# Patient Record
Sex: Female | Born: 1935
Health system: Southern US, Community
[De-identification: ages and names within clinical notes are randomized; demographics above are authoritative.]

## PROBLEM LIST (undated history)

## (undated) DIAGNOSIS — F419 Anxiety disorder, unspecified: Secondary | ICD-10-CM

## (undated) DIAGNOSIS — G4730 Sleep apnea, unspecified: Secondary | ICD-10-CM

## (undated) DIAGNOSIS — K219 Gastro-esophageal reflux disease without esophagitis: Secondary | ICD-10-CM

## (undated) DIAGNOSIS — J189 Pneumonia, unspecified organism: Secondary | ICD-10-CM

## (undated) DIAGNOSIS — M1990 Unspecified osteoarthritis, unspecified site: Secondary | ICD-10-CM

## (undated) DIAGNOSIS — E7800 Pure hypercholesterolemia, unspecified: Secondary | ICD-10-CM

## (undated) DIAGNOSIS — I10 Essential (primary) hypertension: Secondary | ICD-10-CM

## (undated) DIAGNOSIS — K649 Unspecified hemorrhoids: Secondary | ICD-10-CM

## (undated) DIAGNOSIS — E039 Hypothyroidism, unspecified: Secondary | ICD-10-CM

## (undated) DIAGNOSIS — D126 Benign neoplasm of colon, unspecified: Secondary | ICD-10-CM

## (undated) DIAGNOSIS — I499 Cardiac arrhythmia, unspecified: Secondary | ICD-10-CM

## (undated) HISTORY — PX: JOINT REPLACEMENT: SHX530

## (undated) HISTORY — PX: TONSILLECTOMY: SUR1361

## (undated) HISTORY — DX: Sleep apnea, unspecified: G47.30

## (undated) HISTORY — DX: Benign neoplasm of colon, unspecified: D12.6

## (undated) HISTORY — PX: BREAST REDUCTION SURGERY: SHX8

## (undated) HISTORY — DX: Unspecified hemorrhoids: K64.9

## (undated) HISTORY — DX: Unspecified osteoarthritis, unspecified site: M19.90

---

## 1997-10-27 ENCOUNTER — Other Ambulatory Visit: Admission: RE | Admit: 1997-10-27 | Discharge: 1997-10-27 | Payer: Self-pay | Admitting: Internal Medicine

## 1998-12-19 ENCOUNTER — Emergency Department (HOSPITAL_COMMUNITY): Admission: EM | Admit: 1998-12-19 | Discharge: 1998-12-19 | Payer: Self-pay | Admitting: *Deleted

## 1999-01-22 ENCOUNTER — Encounter (INDEPENDENT_AMBULATORY_CARE_PROVIDER_SITE_OTHER): Payer: Self-pay | Admitting: Specialist

## 1999-01-22 ENCOUNTER — Ambulatory Visit (HOSPITAL_COMMUNITY): Admission: RE | Admit: 1999-01-22 | Discharge: 1999-01-22 | Payer: Self-pay | Admitting: Gastroenterology

## 1999-02-25 ENCOUNTER — Encounter (INDEPENDENT_AMBULATORY_CARE_PROVIDER_SITE_OTHER): Payer: Self-pay

## 1999-02-25 ENCOUNTER — Other Ambulatory Visit: Admission: RE | Admit: 1999-02-25 | Discharge: 1999-02-25 | Payer: Self-pay | Admitting: Obstetrics and Gynecology

## 2004-04-26 ENCOUNTER — Inpatient Hospital Stay (HOSPITAL_COMMUNITY): Admission: RE | Admit: 2004-04-26 | Discharge: 2004-04-30 | Payer: Self-pay | Admitting: Orthopedic Surgery

## 2006-09-08 ENCOUNTER — Encounter: Payer: Self-pay | Admitting: Internal Medicine

## 2006-09-08 ENCOUNTER — Ambulatory Visit: Payer: Self-pay

## 2007-06-28 ENCOUNTER — Encounter: Admission: RE | Admit: 2007-06-28 | Discharge: 2007-06-28 | Payer: Self-pay | Admitting: Rheumatology

## 2008-09-28 ENCOUNTER — Encounter: Admission: RE | Admit: 2008-09-28 | Discharge: 2008-09-28 | Payer: Self-pay | Admitting: Orthopedic Surgery

## 2008-12-16 ENCOUNTER — Encounter (INDEPENDENT_AMBULATORY_CARE_PROVIDER_SITE_OTHER): Payer: Self-pay | Admitting: *Deleted

## 2009-01-09 ENCOUNTER — Ambulatory Visit: Payer: Self-pay | Admitting: Gastroenterology

## 2009-01-23 ENCOUNTER — Ambulatory Visit: Payer: Self-pay | Admitting: Gastroenterology

## 2009-01-23 ENCOUNTER — Encounter: Payer: Self-pay | Admitting: Gastroenterology

## 2009-01-28 ENCOUNTER — Encounter: Payer: Self-pay | Admitting: Gastroenterology

## 2009-03-09 DIAGNOSIS — J302 Other seasonal allergic rhinitis: Secondary | ICD-10-CM

## 2009-03-09 DIAGNOSIS — M899 Disorder of bone, unspecified: Secondary | ICD-10-CM

## 2009-03-09 HISTORY — DX: Disorder of bone, unspecified: M89.9

## 2009-03-09 HISTORY — DX: Other seasonal allergic rhinitis: J30.2

## 2009-04-23 ENCOUNTER — Inpatient Hospital Stay (HOSPITAL_COMMUNITY): Admission: RE | Admit: 2009-04-23 | Discharge: 2009-04-26 | Payer: Self-pay | Admitting: Neurosurgery

## 2009-04-30 ENCOUNTER — Emergency Department (HOSPITAL_COMMUNITY): Admission: EM | Admit: 2009-04-30 | Discharge: 2009-04-30 | Payer: Self-pay | Admitting: Emergency Medicine

## 2009-07-04 ENCOUNTER — Emergency Department (HOSPITAL_COMMUNITY): Admission: EM | Admit: 2009-07-04 | Discharge: 2009-07-05 | Payer: Self-pay | Admitting: Emergency Medicine

## 2009-07-07 DIAGNOSIS — G459 Transient cerebral ischemic attack, unspecified: Secondary | ICD-10-CM

## 2009-07-07 HISTORY — DX: Transient cerebral ischemic attack, unspecified: G45.9

## 2009-07-22 ENCOUNTER — Encounter: Admission: RE | Admit: 2009-07-22 | Discharge: 2009-07-22 | Payer: Self-pay | Admitting: Internal Medicine

## 2010-02-25 DIAGNOSIS — Z2839 Other underimmunization status: Secondary | ICD-10-CM | POA: Insufficient documentation

## 2010-04-13 ENCOUNTER — Ambulatory Visit (HOSPITAL_COMMUNITY)
Admission: RE | Admit: 2010-04-13 | Discharge: 2010-04-13 | Payer: Self-pay | Source: Home / Self Care | Admitting: Obstetrics and Gynecology

## 2010-05-09 HISTORY — PX: DILATION AND CURETTAGE OF UTERUS: SHX78

## 2010-06-08 NOTE — Procedures (Signed)
Summary: Colonoscopy   Colonoscopy  Procedure date:  01/23/2009  Findings:      Location:  [facility].    Procedures Next Due Date:    Colonoscopy: 02/2014 COLONOSCOPY PROCEDURE REPORT  PATIENT:  Gina, Waller  MR#:  [id number] BIRTHDATE:   10-05-1927, 77 yrs. old   GENDER:   female  ENDOSCOPIST:   Judie Petit T. Russella Dar, MD, [facility]    PROCEDURE DATE:  01/23/2009 PROCEDURE:  Colonoscopy with snare polypectomy, and with hot biopsy ASA CLASS:   Class II INDICATIONS: 1) follow-up of polyp  2) family history of colon cancer. Adenomatous polyp, 02/1994. Father with colon cancer.  MEDICATIONS:    Fentanyl 50 mcg IV, Versed 7 mg IV  DESCRIPTION OF PROCEDURE:   After the risks benefits and alternatives of the procedure were thoroughly explained, informed consent was obtained.  Digital rectal exam was performed and revealed small external hemorrhoids.   The LB [id number]-[id number] endoscope was introduced through the anus and advanced to the cecum, which was identified by both the appendix and ileocecal valve, without limitations.  The quality of the prep was excellent, using MoviPrep.  The instrument was then slowly withdrawn as the colon was fully examined. <<PROCEDUREIMAGES>>            <<OLD IMAGES>>  FINDINGS:  A sessile polyp was found in the ascending colon. It was 5 mm in size. Polyp was snared without cautery. Retrieval was successful.  A sessile polyp was found in the proximal transverse colon. It was 5 mm in size. Polyp was snared without cautery. Retrieval was successful. A sessile polyp was found in the mid transverse colon. It was 5 mm in size. With hot biopsy forceps, the polyp was cauterized and a biopsy was obtained and sent to pathology.  This was otherwise a normal examination of the colon.  Retroflexed views in the rectum revealed internal hemorrhoids, small.  The time to cecum =  3  minutes. The scope was then withdrawn (time =  10.5  min) from the patient and the  procedure completed.  COMPLICATIONS:   None   ENDOSCOPIC IMPRESSION:  1) 5 mm sessile polyp in the ascending colon  2) 5 mm sessile polyp in the proximal transverse colon  3) 5 mm sessile polyp in the mid transverse colon  4) Internal and external hemorrhoids  RECOMMENDATIONS:  1) await pathology results  2) colonoscopy in 5 years, if health appropriate 3) No ASA/NSAIDs for 2 weeks    Kannen Moxey T. Russella Dar, MD, [facility]    CC: Chilton Greathouse, MD      REPORT OF SURGICAL PATHOLOGY   Case #: [id number] Patient Name: Gina Waller, Gina B. Office Chart Number:  N/A [id number] MRN: [id number] Pathologist: Ferd Hibbs. Colonel Bald, MD DOB/Age  08-Mar-1928 (Age: 77)    Gender: F Date Taken:  01/23/2009 Date Received: 01/23/2009   FINAL DIAGNOSIS   ***MICROSCOPIC EXAMINATION AND DIAGNOSIS***   COLON, ASCENDING TRANSVERSE, POLYPS: - TUBULAR ADENOMAS (3). - HIGH GRADE DYSPLASIA IS NOT IDENTIFIED.    mw Date Reported:  01/26/2009     Ferd Hibbs. Colonel Bald, MD *** Electronically Signed Out By [facility] ***     January 28, 2009 MRN: [id number]    [facility] [address] [place], [place]  [address]    Dear Ms. Orvan Falconer,  I am pleased to inform you that the colon polyp(s) removed during your recent colonoscopy was (were) found to be benign (no cancer detected) upon pathologic examination.  I recommend you have a  repeat colonoscopy examination in 5 years to look for recurrent polyps, as having colon polyps increases your risk for having recurrent polyps or even colon cancer in the future.  Should you develop new or worsening symptoms of abdominal pain, bowel habit changes or bleeding from the rectum or bowels, please schedule an evaluation with either your primary care physician or with me.  Continue treatment plan as outlined the day of your exam.  Please call us if you are having persistent problems or have questions about your condition that have not been fully answered at this  time.  Sincerely,  Meryl Dare MD [facility]  This letter has been electronically signed by your physician.     This report was created from the original endoscopy report, which was reviewed and signed by the above listed endoscopist.

## 2010-06-08 NOTE — Letter (Signed)
Summary: Patient Notice- Polyp Results  [facility]  [address] [place], [place] [address]   Phone: [phone]  Fax: [phone]        January 28, 2009 MRN: [id number]    [facility] [address] [place], [place]  [address]    Dear Ms. Orvan Falconer,  I am pleased to inform you that the colon polyp(s) removed during your recent colonoscopy was (were) found to be benign (no cancer detected) upon pathologic examination.  I recommend you have a repeat colonoscopy examination in 5 years to look for recurrent polyps, as having colon polyps increases your risk for having recurrent polyps or even colon cancer in the future.  Should you develop new or worsening symptoms of abdominal pain, bowel habit changes or bleeding from the rectum or bowels, please schedule an evaluation with either your primary care physician or with me.  Continue treatment plan as outlined the day of your exam.  Please call us if you are having persistent problems or have questions about your condition that have not been fully answered at this time.  Sincerely,  Meryl Dare MD [facility]  This letter has been electronically signed by your physician.

## 2010-06-08 NOTE — Letter (Signed)
Summary: Recall Colonoscopy Letter  [facility]  [address] [place], [place] [address]   Phone: [phone]  Fax: [phone]      December 16, 2008 MRN: [id number]   [facility] [address] [place], [place]  [address]   Dear Ms. Orvan Falconer,   According to your medical record, it is time for you to schedule a Colonoscopy. The American Cancer Society recommends this procedure as a method to detect early colon cancer. Patients with a family history of colon cancer, or a personal history of colon polyps or inflammatory bowel disease are at increased risk.  This letter has beeen generated based on the recommendations made at the time of your procedure. If you feel that in your particular situation this may no longer apply, please contact our office.  Please call our office at [phone] to schedule this appointment or to update your records at your earliest convenience.  Thank you for cooperating with [place] to provide you with the very best care possible.   Sincerely,  Judie Petit T. Pleas Koch, M.D.  [facility] [phone]

## 2010-06-09 NOTE — H&P (Signed)
  NAMEORENA, CAVAZOS             ACCOUNT NO.:  [id number]  MEDICAL RECORD NO.:  [id number]          PATIENT TYPE:  AMB  LOCATION:  SDC                           FACILITY:  [facility]  PHYSICIAN:  Malachi Pro. Ambrose Mantle, M.D. DATE OF BIRTH:  [birth year]  DATE OF ADMISSION:  04/13/2010 DATE OF DISCHARGE:  04/13/2010                             HISTORY & PHYSICAL   This is an 77 year old white female para 0 who presented for evaluation of abnormal uterine bleeding.  The patient had a polypectomy on October 13, 2009, and over the last 2 months prior to the hospitalization for D and C on March 16, 2010, had had 3 episodes of vaginal bleeding.  She is admitted now for D and C and hysteroscopy.  PAST MEDICAL HISTORY:  Gastroesophageal reflux disorder, hyperlipidemia, hypertension, hypothyroidism, osteopenia, sleep apnea, and TIA.  SURGICAL HISTORY:  She has had disk surgery on her back, knee replacement, reduction mammoplasty, and tonsillectomy.  MEDICATIONS:  Amlodipine 5 mg once daily, Bystolic 10 mg once daily, Crestor 20 mg once daily, Micardis 80 mg once daily, pantoprazole 40 mg once daily, Synthroid 50 mcg once daily, and she takes vitamin D, vitamin E, calcium, and minerals.  ALLERGIES:  LODINE cause stiffness of her arms, she is not allergic to latex.  FAMILY HISTORY:  She had a family history of breast cancer in her maternal niece, colon cancer in her father.  OBSTETRIC HISTORY:  She has had no obstetric history.  SOCIAL HISTORY:  She denies alcohol, tobacco, or drug abuse.  PHYSICAL EXAMINATION:  GENERAL:  Well-developed, well-nourished white female, no distress. VITAL SIGNS:  Blood pressure 138/82, pulse of 80. HEAD, EYES, EARS, NOSE AND THROAT:  Normal. LUNGS:  Clear to auscultation. HEART:  Normal size and sounds.  No murmurs. ABDOMEN:  Soft and nontender. PELVIC:  Vulva and vagina showed uterine prolapse.  The cervix was noted at the introitus.  Urethra was nontender.  No  masses.  Cervix was at the introitus and the uterus was midline and small.  The adnexa were free. Anus was normal.  ADMITTING IMPRESSION:  History of endocervical polyp with persistent postmenopausal bleeding.  The patient is admitted for D and C, hysteroscopy.     Malachi Pro. Ambrose Mantle, M.D.     TFH/MEDQ  D:  06/03/2010  T:  06/04/2010  Job:  [id number]  Electronically Signed by Tracey Harries M.D. on 06/09/2010 08:34:43 AM

## 2010-07-20 LAB — DIFFERENTIAL
Basophils Absolute: 0.1 10*3/uL (ref 0.0–0.1)
Basophils Relative: 1 % (ref 0–1)
Eosinophils Absolute: 0.1 10*3/uL (ref 0.0–0.7)
Eosinophils Relative: 2 % (ref 0–5)
Lymphocytes Relative: 27 % (ref 12–46)
Lymphs Abs: 1.7 10*3/uL (ref 0.7–4.0)
Monocytes Absolute: 0.4 10*3/uL (ref 0.1–1.0)
Monocytes Relative: 6 % (ref 3–12)
Neutro Abs: 4 10*3/uL (ref 1.7–7.7)
Neutrophils Relative %: 63 % (ref 43–77)

## 2010-07-20 LAB — CBC
HCT: 38.4 % (ref 36.0–46.0)
Hemoglobin: 13 g/dL (ref 12.0–15.0)
MCH: 30.4 pg (ref 26.0–34.0)
MCHC: 33.9 g/dL (ref 30.0–36.0)
MCV: 89.8 fL (ref 78.0–100.0)
Platelets: 317 10*3/uL (ref 150–400)
RBC: 4.28 MIL/uL (ref 3.87–5.11)
RDW: 13.9 % (ref 11.5–15.5)
WBC: 6.3 10*3/uL (ref 4.0–10.5)

## 2010-07-20 LAB — COMPREHENSIVE METABOLIC PANEL
ALT: 20 U/L (ref 0–35)
AST: 21 U/L (ref 0–37)
Albumin: 3.7 g/dL (ref 3.5–5.2)
Alkaline Phosphatase: 67 U/L (ref 39–117)
BUN: 18 mg/dL (ref 6–23)
CO2: 29 mEq/L (ref 19–32)
Calcium: 10 mg/dL (ref 8.4–10.5)
Chloride: 100 mEq/L (ref 96–112)
Creatinine, Ser: 0.82 mg/dL (ref 0.4–1.2)
GFR calc non Af Amer: >60 mL/min (ref >60)
Glucose, Bld: 96 mg/dL (ref 70–99)
Potassium: 3.5 mEq/L (ref 3.5–5.1)
Sodium: 137 mEq/L (ref 135–145)
Total Bilirubin: 0.2 mg/dL — ABNORMAL LOW (ref 0.3–1.2)
Total Protein: 7.2 g/dL (ref 6.0–8.3)

## 2010-07-28 LAB — COMPREHENSIVE METABOLIC PANEL
ALT: 19 U/L (ref 0–35)
AST: 21 U/L (ref 0–37)
Albumin: 4.2 g/dL (ref 3.5–5.2)
Alkaline Phosphatase: 76 U/L (ref 39–117)
BUN: 20 mg/dL (ref 6–23)
CO2: 29 mEq/L (ref 19–32)
Calcium: 10 mg/dL (ref 8.4–10.5)
Chloride: 104 mEq/L (ref 96–112)
Creatinine, Ser: 0.89 mg/dL (ref 0.4–1.2)
GFR calc non Af Amer: >60 mL/min (ref >60)
Glucose, Bld: 108 mg/dL — ABNORMAL HIGH (ref 70–99)
Potassium: 4 mEq/L (ref 3.5–5.1)
Sodium: 141 mEq/L (ref 135–145)
Total Bilirubin: 0.5 mg/dL (ref 0.3–1.2)
Total Protein: 7.7 g/dL (ref 6.0–8.3)

## 2010-07-28 LAB — URINE MICROSCOPIC-ADD ON

## 2010-07-28 LAB — CBC
HCT: 41.9 % (ref 36.0–46.0)
Hemoglobin: 14 g/dL (ref 12.0–15.0)
MCHC: 33.4 g/dL (ref 30.0–36.0)
MCV: 87.3 fL (ref 78.0–100.0)
Platelets: 331 10*3/uL (ref 150–400)
RBC: 4.8 MIL/uL (ref 3.87–5.11)
RDW: 14.5 % (ref 11.5–15.5)
WBC: 7.4 10*3/uL (ref 4.0–10.5)

## 2010-07-28 LAB — URINALYSIS, ROUTINE W REFLEX MICROSCOPIC
Bilirubin Urine: NEGATIVE
Glucose, UA: NEGATIVE mg/dL
Hgb urine dipstick: NEGATIVE
Ketones, ur: NEGATIVE mg/dL
Nitrite: NEGATIVE
Protein, ur: NEGATIVE mg/dL
Specific Gravity, Urine: 1.015 (ref 1.005–1.030)
Urobilinogen, UA: 0.2 mg/dL (ref 0.0–1.0)
pH: 5.5 (ref 5.0–8.0)

## 2010-07-28 LAB — PROTIME-INR
INR: 1.02 (ref 0.00–1.49)
Prothrombin Time: 13.3 seconds (ref 11.6–15.2)

## 2010-07-28 LAB — POCT CARDIAC MARKERS
CKMB, poc: 2.7 ng/mL (ref 1.0–8.0)
Myoglobin, poc: 133 ng/mL (ref 12–200)
Troponin i, poc: <0.05 ng/mL (ref 0.00–0.09)

## 2010-07-28 LAB — APTT: aPTT: 36 seconds (ref 24–37)

## 2010-08-09 LAB — CBC
HCT: 33.4 % — ABNORMAL LOW (ref 36.0–46.0)
Hemoglobin: 11.5 g/dL — ABNORMAL LOW (ref 12.0–15.0)
MCHC: 34.4 g/dL (ref 30.0–36.0)
MCV: 89.2 fL (ref 78.0–100.0)
Platelets: 207 10*3/uL (ref 150–400)
RBC: 3.74 MIL/uL — ABNORMAL LOW (ref 3.87–5.11)
RDW: 13.8 % (ref 11.5–15.5)
WBC: 8.4 10*3/uL (ref 4.0–10.5)

## 2010-08-09 LAB — BASIC METABOLIC PANEL
BUN: 7 mg/dL (ref 6–23)
CO2: 28 mEq/L (ref 19–32)
Calcium: 8.1 mg/dL — ABNORMAL LOW (ref 8.4–10.5)
Chloride: 97 mEq/L (ref 96–112)
Creatinine, Ser: 0.79 mg/dL (ref 0.4–1.2)
GFR calc non Af Amer: >60 mL/min (ref >60)
Glucose, Bld: 110 mg/dL — ABNORMAL HIGH (ref 70–99)
Potassium: 3.1 mEq/L — ABNORMAL LOW (ref 3.5–5.1)
Sodium: 131 mEq/L — ABNORMAL LOW (ref 135–145)

## 2010-08-10 LAB — CBC
HCT: 40.8 % (ref 36.0–46.0)
Hemoglobin: 13.8 g/dL (ref 12.0–15.0)
MCHC: 33.9 g/dL (ref 30.0–36.0)
MCV: 88.9 fL (ref 78.0–100.0)
Platelets: 287 10*3/uL (ref 150–400)
RBC: 4.59 MIL/uL (ref 3.87–5.11)
RDW: 13.9 % (ref 11.5–15.5)
WBC: 7.2 10*3/uL (ref 4.0–10.5)

## 2010-08-10 LAB — BASIC METABOLIC PANEL
BUN: 11 mg/dL (ref 6–23)
CO2: 27 mEq/L (ref 19–32)
Calcium: 9.7 mg/dL (ref 8.4–10.5)
Chloride: 102 mEq/L (ref 96–112)
Creatinine, Ser: 0.78 mg/dL (ref 0.4–1.2)
GFR calc non Af Amer: >60 mL/min (ref >60)
Glucose, Bld: 85 mg/dL (ref 70–99)
Potassium: 3.9 mEq/L (ref 3.5–5.1)
Sodium: 137 mEq/L (ref 135–145)

## 2010-08-10 LAB — TYPE AND SCREEN
ABO/RH(D): A POS
Antibody Screen: NEGATIVE

## 2010-08-10 LAB — ABO/RH: ABO/RH(D): A POS

## 2010-09-24 NOTE — Op Note (Signed)
Gina Waller, Gina Waller             ACCOUNT NO.:  [id number]   MEDICAL RECORD NO.:  [id number]          PATIENT TYPE:  INP   LOCATION:  5023                         FACILITY:  [facility]   PHYSICIAN:  Mila Homer. Sherlean Foot, M.D. DATE OF BIRTH:  [birth year]   DATE OF PROCEDURE:  04/26/2004  DATE OF DISCHARGE:                                 OPERATIVE REPORT   PREOPERATIVE DIAGNOSIS:  Right knee osteoarthritis.   POSTOPERATIVE DIAGNOSIS:  Right knee osteoarthritis.   OPERATION PERFORMED:  Right total knee arthroplasty.   SURGEON:  Mila Homer. Sherlean Foot, M.D.   ASSISTANT:  Richardean Canal, P.A.   ANESTHESIA:  General.   INDICATIONS FOR PROCEDURE:  The patient is a 77 year old white female with  failure of conservative measures for osteoarthritis of the knee.   DESCRIPTION OF PROCEDURE:  The patient was laid supine and administered  general anesthesia.  The right lower extremity was prepped and draped in the  usual sterile fashion.  A #10 blade was used to make a midline incision.  A  new 10 blade was used to make a median parapatellar arthrotomy and  synovectomy.  Deep MCL was elevated off the medial crest of the tibia. The  knee was brought into flexion.  Extramedullary alignment system was used to  make a perpendicular cut to the anatomic axis of the tibia removing 2 mm of  bone off the medial side.  The intramedullary drill hole was made in the  femur and the distal femoral cutting guide was put into place.  Using  sagittal saw to make the distal femoral cut.  I then measured out the  posterior condylar angle.  It measured 3 degrees.  I used a sizing device to  size to a size D.  A put a 4 in 1 size D cutting block in place and made the  anterior, posterior and chamfer cuts of the sagittal saw.  I then put a  lamina spreader in the knee and removed all of the loose pieces of bone. I  removed the ACL, PCL, medial and lateral menisci and posterior condylar  osteophytes.  I then balanced the knee  with a 10 mm spacer block.  I then  finished the femur with a size D finishing block, finished the tibia with a  size 4 tibial tray.  I then irrigated and cemented in a size 4 tibia, size D  femur, snapped in the real 10 polyethylene and removed all excess cement.  I  then cemented in a size 32 patella.  I then let the cement harden.  The  tourniquet was let down at 55 minutes.  I obtained hemostasis and left a  Hemovac coming out superolaterally deep to the arthrotomy.  I closed the  arthrotomy with interrupted figure-of-eight Vicryl sutures.  I closed the  deep soft tissues with interrupted 0 Vicryl sutures and subcuticular 2-0  Vicryl stitches.  I then placed skin staples, dressed with Adaptic, 4 x 4s,  sterile Webril and Ace wrap.   COMPLICATIONS:  None.   DRAINS:  None.       SDL/MEDQ  D:  04/28/2004  T:  04/29/2004  Job:  [id number]

## 2010-09-24 NOTE — H&P (Signed)
NAMEHARLEIGH, Gina Waller             ACCOUNT NO.:  [id number]   MEDICAL RECORD NO.:  [id number]          PATIENT TYPE:  INP   LOCATION:                               FACILITY:  [facility]   PHYSICIAN:  Mila Homer. Sherlean Foot, M.D. DATE OF BIRTH:  [birth year]   DATE OF ADMISSION:  04/26/2004  DATE OF DISCHARGE:                                HISTORY & PHYSICAL   CHIEF COMPLAINT:  Left knee pain for the last 3 years.   HISTORY OF PRESENT ILLNESS:  This 77 year old white female patient presented  to Dr. Mila Homer. Lucey with a history of a left knee arthroscopy by Dr.  Vania Rea. Supple in 2002.  She was told she had arthritis at that time and  that it would eventually progress to her requiring a knee replacement.  She  has had no other injury to her left knee, but over the last year or so, the  pain has been getting progressively worse.  It started up suddenly again and  just been getting worse.   At this point, the pain is an intermittent aching right around the knee cap  itself without radiation.  The pain increases when she moves from a sitting  to a standing position and decreases with sitting and rest.  The knee feels  weak at times.  There is no popping, catching, grinding, locking, giving-  way, swelling or night pain.  She is not using any assistive devices to  ambulate.  She has tried cortisone and Synvisc injections in the past and  the Synvisc did provide some relief.  She is not currently taking any  medicines for pain.   ALLERGIES:  LODINE causes stiffness.   CURRENT MEDICATIONS:  1.  Micardis 80 mg 1 tablet p.o. q.a.m.  2.  Multivitamin 1 tablet p.o. q.a.m.  3.  Hydrochlorothiazide 25 mg 1 tablet p.o. q.a.m.  4.  Nexium 40 mg 1 tablet p.o. q.a.m.  5.  Calcium, magnesium and zinc 334 mg 1 tablet p.o. t.i.d.  6.  Vitamin E 400 IU 1 tablet p.o. q.a.m.  7.  Glucosamine plus MSW 500/500 mg 1 tablet p.o. q.a.m.  8.  Vytorin 10/40 mg 1 tablet p.o. q.p.m.  9.  Toprol-XL 200 mg 1 tablet  p.o. nightly.  10. Norvasc 5 mg 1 tablet p.o. nightly.  11. Fosamax 70 mg 1 tablet p.o. every Sunday.   PAST MEDICAL HISTORY:  1.  Hypertension for the last 20 years.  2.  Gastroesophageal reflux disease diagnosed 5-6 years ago.  3.  Hypercholesterolemia.  4.  Osteoporosis.   She denies any history of diabetes mellitus, thyroid disease, hiatal hernia,  peptic ulcer disease, heart disease, asthma or any other chronic medical  condition other than noted previously.   PAST SURGICAL HISTORY:  1.  Tonsillectomy,  29 or 77 years old.  2.  Breast reduction surgery in 1993 by Dr. Pleas Patricia.  3.  Left knee arthroscopy by Dr. Francena Hanly, 2002.   She denies any complications from the above-mentioned procedures.   SOCIAL HISTORY:  She denies any history of cigarette smoking, alcohol use or  drug use.  She is married and does not have any children.  She and her  husband live in a 1-story house with 4-6 steps into the regular entrance.  She is a retired [employer] information [employer] manager.  Her medical doctor is Dr. Larina Earthly at [facility] and his phone number is [phone].   FAMILY HISTORY:  Mother died at the age of 44 with pneumonia.  Father died  at the age of 68 with heart failure, colon cancer and diabetes.  She had 1  brother who passed away at age 53 due to a drowning incident and 2 sisters  who are living, 1 age 91 with depression and 1 age 78 with cancer.   REVIEW OF SYSTEMS:  She does wear glasses.  She has some seasonal allergies.  She complains of shortness of breath going up and down stairs due to  deconditioning.  She has had a recent cough which was treated by her medical  doctor with over-the-counter medications.  She does have a history of sleep  apnea which she treats with CPAP.  She does have a small hemorrhoid at this  time.  She does complain of occasional constipation treated with over-the-  counter medications.  She has some psoriatic arthritis of her hands,   especially her index, long and ring finger.  She has been treated with  methotrexate for that in the past, but she is now in remission.  She does  not have a living will nor a power of attorney.  All other systems are  negative and noncontributory.   PHYSICAL EXAM:  GENERAL:  Well-developed, well-nourished white female in no  acute distress.  Talks easily with the examiner.  Accompanied by her  husband.  Walks with a slightly antalgic gait.  Mood and affect are  appropriate.  Height 5 feet 3-1/2 inches, weight 178 pounds, BMI is 30.  VITAL SIGNS:  Temperature 97.2 degrees Fahrenheit, pulse 68, respirations 16  and BP 162/90.  HEENT:  Normocephalic, atraumatic, without frontal or maxillary sinus  tenderness to palpation.  Conjunctivae are pink.  Sclerae are anicteric.  PERRLA.  EOMs intact.  No visible external ear deformities.  Hearing grossly  intact.  Tympanic membranes are pearly gray bilaterally with good light  reflex.  Nose and nasal septum midline.  Nasal mucosa pink and moist without  exudates or polyps noted.  Buccal mucosa pink and moist.  Good dentition.  Pharynx without erythema or exudate.  Tongue and uvula midline.  Tongue  without fasciculations and uvula rises equally with phonation.  NECK:  No visible masses or lesions noted.  Trachea midline.  No palpable  lymphadenopathy nor thyromegaly.  Carotids +2 bilaterally without bruits.  Full range of motion and nontender to palpation along the cervical spine.  CARDIOVASCULAR:  Heart rate and rhythm regular.  S1 and S2 present without  rubs, clicks or murmurs noted.  RESPIRATORY:  Respirations even and unlabored.  Breath sounds clear to  auscultation bilaterally without rales or wheezes noted.  ABDOMEN:  Rounded abdominal contour.  Bowel sounds present x4 quadrants.  Soft and nontender to palpation without hepatosplenomegaly nor CVA  tenderness.  Femoral pulses +2 bilaterally. BACK:  Nontender to palpation along the vertebral  column.  BREASTS/GU/RECTAL/PELVIC:  These exams deferred at this time.  MUSCULOSKELETAL:  No obvious deformities of bilateral upper extremities with  full range of motion of these extremities without pain.  Radial pulses +2  bilaterally.  She has full range of motion of  her hips, ankles and toes  bilaterally.  DP and PT pulses are +2, but she does have +2 mildly pitting  bilateral lower extremity edema.  No calf pain with palpitation and negative  Homans sign bilaterally.  Right knee has full extension and flexion to 135  degrees with a minimal amount of crepitus.  There is no pain with palpation  along the joint line and no effusion.  Stable to varus and valgus stress,  negative anterior drawer.  Left knee skin is intact without erythema or  ecchymosis.  Well-healed arthroscopy portal sites.  She has full extension  and flexion to 120 degrees with a moderate amount of crepitus.  She is  acutely tender to palpation over the lateral joint line.  Effusion +1 to 2  in the knee.  Stable to varus and valgus stress, negative anterior drawer.  NEUROLOGIC:  Alert and oriented x3.  Cranial nerves II-XII are grossly  intact.  Strength 5/5 in bilateral upper and lower extremities.  Rapid  alternating movements intact.  Deep tendon reflexes 2+ in bilateral upper  and lower extremities.   IMPRESSION:  1.  End-stage osteoarthritis, left knee.  2.  Hypertension.  3.  Gastroesophageal reflux disease.  4.  Hypercholesterolemia.  5.  Osteoporosis.  6.  Sleep apnea.  7.  Hemorrhoids.  8.  Psoriatic arthritis of the hands, now in remission.   PLAN:  Ms. Brisky will be admitted to [facility] on April 26, 2004, where she will undergo a left total knee arthroplasty by Dr. Mila Homer. Lucey.  She will undergo all the routine preoperative laboratory tests  and studies prior to this procedure.  If she has any medical issues while  she is hospitalized, we will consult Dr. Vicente Males  group.       ________________________________________  Legrand Pitts. Duffy, P.A.  ___________________________________________  Mila Homer. Sherlean Foot, M.D.    KED/MEDQ  D:  04/20/2004  T:  04/20/2004  Job:  [id number]

## 2010-09-24 NOTE — Discharge Summary (Signed)
NAMEJADDA, Gina Waller             ACCOUNT NO.:  [id number]   MEDICAL RECORD NO.:  [id number]          PATIENT TYPE:  INP   LOCATION:  5023                         FACILITY:  [facility]   PHYSICIAN:  Mila Homer. Sherlean Foot, M.D. DATE OF BIRTH:  1927/11/06   DATE OF ADMISSION:  04/26/2004  DATE OF DISCHARGE:  04/30/2004                                 DISCHARGE SUMMARY   DISPOSITION:  To home.   ADMISSION DIAGNOSES:  1.  End-stage osteoarthritis, left knee.  2.  Hypertension.  3.  Gastroesophageal reflux disease.  4.  Hypercholesterolemia.  5.  Osteoporosis.  6.  Sleep apnea.  7.  Hemorrhoids.  8.  Psoriatic arthritis, now in remission.   DISCHARGE DIAGNOSES:  1.  Left total knee arthroplasty.  2.  Postoperative blood loss anemia.  3.  Hyponatremia.  4.  Hypokalemia.  5.  Hypertension.  6.  Gastroesophageal reflux disease.  7.  Hypercholesterolemia.  8.  Osteoporosis.  9.  Sleep apnea.   HISTORY OF PRESENT ILLNESS:  The patient is a 77 year old white female with  a history of left knee arthroscopy with Dr. Rennis Chris several years previous,  presents with three years' history of progressively worsening left knee  pain.  The pain is intermittent, a deep aching sensation around the patella  with no radiation.  It worsens with activity, improves with rest.  She noted  no improvement with cortisone or Synvisc injections.   ALLERGIES:  LODINE.   CURRENT MEDICATIONS:  1.  Micardis 80 mg p.o. daily.  2.  Hydrochlorothiazide 25 mg p.o. daily.  3.  Nexium 40 mg p.o. daily.  4.  Multivitamins one tablet p.o. daily.  5.  Calcium, magnesium, and zinc tablet one tablet p.o. t.i.d.  6.  Glucosamine chondroitin.  7.  Vytorin 10/40 mg p.o. daily.  8.  Toprol XL 200 mg p.o. q.h.s.  9.  Norvasc 5 mg p.o. q.h.s.  10. Fosamax 78 mg p.o. weekly.   SURGICAL PROCEDURE:  On April 16, 2004 the patient was taken to the OR by  Dr. Georgena Spurling, assisted by Rexene Edison, P.A.-C.  Under general  anesthesia  the patient underwent a left total knee arthroplasty without any  complications.  The patient had the following components implanted:  A size  D left femoral component, a size 4 fluted tibial tray, size 32 mm diameter,  8.5 mm thickness patella component, size yellow 10 mm polyethylene bearing.  All components were implanted  with polymethylmethacrylate.  The patient was  transferred to the recovery room and to the orthopedic floor for protocol  for a total knee arthroplasty.  She was placed on IV antibiotics, pain  medications, and started on Lovenox for DVT prophylaxis.   CONSULTATIONS:  The following routine consults were requested:  1.  Physical therapy.  2.  Occupational therapy.  3.  Rehab.   A hospitalist consult was also requested for evaluation of the patient's  multiple medical issues and hyponatremia and kalemia.   HOSPITAL COURSE:  On April 26, 2004 the patient was admitted to [facility] under the care of Dr. Georgena Spurling.  The patient was taken to  the OR where a left total knee arthroplasty was performed without any  complications.  The patient was transferred to the recovery room and to the  orthopedic floor with IV antibiotics, pain medications, her leg in a CPM,  and being placed on Lovenox for DVT prophylaxis.   The patient then improved with a total of four days' postoperative care in  which the patient initially on the first postoperative day #1, felt like her  throat was swollen and she was having problems with her breathing.  The  patient did not appear to be in any distress whatsoever.  She had normal  lung sounds, no stridor.  Her saturations were 97%.  Vital signs otherwise  normal.  I think this was just an anxiety issue.  We also had anesthesia  come up and evaluate the patient and discussion with the patient and no  further treatments were necessary and the patient's issues resolved.   The patient also developed some significant  hyponatremia and kalemia with a  sodium of 119 and a potassium of 2.9.  These were evaluated by the  hospitalists' group with adjustments of medications, being placed on free  fluid restrictions.  This gradually corrected itself with supplemental  potassium over the next several days and had completely resolved by  discharge.   The patient's vital signs otherwise remained stable.  She remained afebrile.  Her wounds remained benign for any signs of infection.  Her leg remained  neuromotor vascularly intact.  The patient was transitioned from IV  medications to p.o. medications on postoperative day #2 without any  difficulty.  The patient worked very well with physical therapy, so it was  felt on postoperative day #4, she was medically and orthopedically stable  and ready for discharge home.  Arrangements were made for outpatient  physical therapy per protocol and the patient was discharged in good  condition.   LABORATORY DATA:  CBC on December 23:  WBC 7.2, hemoglobin 9.1, hematocrit  26.2, platelets 321.  Routine chemistries on December 23:  Sodium 134,  potassium 3.8, glucose 111, BUN 9, creatinine 0.8.  Elevation of glucose was  felt to be just routine postoperative stress, inactivity, diet, as her  glucose was 92 on admission.   Routine urinalysis on admission and rechecked during her hospitalization was  normal.   DISCHARGE MEDICATIONS:  1.  Colace 100 mg p.o. b.i.d.  2.  Trinsicon one tablet p.o. t.i.d.  3.  Lovenox 30 mg subcu q.12h.  4.  Percocet one or two tablets every 4-6 hours p.r.n. pain.  5.  Tylenol 650 mg p.o. q.4h. p.r.n.  6.  Robaxin 500 mg p.o. q.6h. p.r.n.  7.  Restoril 15 mg p.o. q.h.s. p.r.n.  8.  Avapro 300 mg p.o. daily.  9.  Hydrochlorothiazide 25 mg p.o. daily.  10. Protonix 40 mg p.o. daily.  11. Toprol XL 200 mg p.o. q.h.s.  12. Norvasc 5 mg p.o. q.h.s.  13. Multivitamin with minerals one tablet p.o. daily.  14. Potassium chloride 20 mEq p.o.  t.i.d.  DISCHARGE INSTRUCTIONS:  1.  The patient is to resume routine home medications except for the      hydrochlorothiazide.  2.  Vicodin 5 mg one or two tablets every 4-6 hours for pain if needed.  3.  Lovenox 40 mg injection once a day for the next five days.  4.  The patient is to continue water restriction for another 2-3 days.  ACTIVITY:  As tolerated with the use of a walker.   DIET:  No restrictions.   WOUND CARE:  The patient may shower on the following Sunday.  Keep wound  clean.  The patient is to follow blue instruction sheet for any concerns  about the wound.   FOLLOW UP:  1.  Call [phone] for follow-up appointment on January 3 with Dr. Sherlean Foot.  2.  The patient is to call Dr. Vicente Males office for a follow-up appointment      with him of the week of January 6.   CONDITION ON DISCHARGE:  Improved and good.      RWK/MEDQ  D:  07/07/2004  T:  07/07/2004  Job:  [id number]

## 2010-12-06 ENCOUNTER — Other Ambulatory Visit: Payer: Self-pay | Admitting: Neurosurgery

## 2010-12-06 DIAGNOSIS — M549 Dorsalgia, unspecified: Secondary | ICD-10-CM

## 2010-12-09 ENCOUNTER — Other Ambulatory Visit: Payer: Self-pay

## 2011-02-16 DIAGNOSIS — E669 Obesity, unspecified: Secondary | ICD-10-CM

## 2011-02-16 HISTORY — DX: Obesity, unspecified: E66.9

## 2011-03-16 ENCOUNTER — Emergency Department (HOSPITAL_COMMUNITY)
Admission: EM | Admit: 2011-03-16 | Discharge: 2011-03-16 | Disposition: A | Discharge status: Home / Self Care | Payer: Medicare Other | Attending: Emergency Medicine | Admitting: Emergency Medicine

## 2011-03-16 ENCOUNTER — Emergency Department (HOSPITAL_COMMUNITY): Payer: Medicare Other

## 2011-03-16 DIAGNOSIS — S60229A Contusion of unspecified hand, initial encounter: Secondary | ICD-10-CM | POA: Insufficient documentation

## 2011-03-16 DIAGNOSIS — S8002XA Contusion of left knee, initial encounter: Secondary | ICD-10-CM

## 2011-03-16 DIAGNOSIS — W010XXA Fall on same level from slipping, tripping and stumbling without subsequent striking against object, initial encounter: Secondary | ICD-10-CM | POA: Insufficient documentation

## 2011-03-16 DIAGNOSIS — Y93K1 Activity, walking an animal: Secondary | ICD-10-CM | POA: Insufficient documentation

## 2011-03-16 DIAGNOSIS — S60222A Contusion of left hand, initial encounter: Secondary | ICD-10-CM

## 2011-03-16 DIAGNOSIS — S8000XA Contusion of unspecified knee, initial encounter: Secondary | ICD-10-CM | POA: Insufficient documentation

## 2011-03-16 DIAGNOSIS — E7800 Pure hypercholesterolemia, unspecified: Secondary | ICD-10-CM | POA: Insufficient documentation

## 2011-03-16 DIAGNOSIS — I10 Essential (primary) hypertension: Secondary | ICD-10-CM | POA: Insufficient documentation

## 2011-03-16 DIAGNOSIS — S022XXA Fracture of nasal bones, initial encounter for closed fracture: Secondary | ICD-10-CM | POA: Insufficient documentation

## 2011-03-16 DIAGNOSIS — S0081XA Abrasion of other part of head, initial encounter: Secondary | ICD-10-CM

## 2011-03-16 DIAGNOSIS — R229 Localized swelling, mass and lump, unspecified: Secondary | ICD-10-CM | POA: Insufficient documentation

## 2011-03-16 DIAGNOSIS — IMO0002 Reserved for concepts with insufficient information to code with codable children: Secondary | ICD-10-CM | POA: Insufficient documentation

## 2011-03-16 DIAGNOSIS — W19XXXA Unspecified fall, initial encounter: Secondary | ICD-10-CM

## 2011-03-16 HISTORY — DX: Essential (primary) hypertension: I10

## 2011-03-16 HISTORY — DX: Pure hypercholesterolemia, unspecified: E78.00

## 2011-03-16 HISTORY — DX: Gastro-esophageal reflux disease without esophagitis: K21.9

## 2011-03-16 MED ORDER — ACETAMINOPHEN 325 MG PO TABS
650.0000 mg | ORAL_TABLET | Freq: Once | ORAL | Status: AC
  Administered 2011-03-16: 650 mg via ORAL
  Filled 2011-03-16: qty 2

## 2011-03-16 NOTE — ED Notes (Signed)
Pt was walking the dog and tripped over him, she has several abrasions on her face and hand, her left knee is injured

## 2011-03-16 NOTE — ED Notes (Signed)
Pt to ED with s/p fall. Pt states that she tripped and flipped over her dog landing on cement. Pt denies any LOC at time of fall

## 2011-03-16 NOTE — ED Provider Notes (Signed)
History     CSN: [id number] Arrival date & time: 03/16/2011  5:38 AM   First MD Initiated Contact with Patient 03/16/11 [phone]      Chief Complaint  Patient presents with  . Fall    (Consider location/radiation/quality/duration/timing/severity/associated sxs/prior treatment) Patient is a 77 y.o. female presenting with fall. The history is provided by the patient.  Fall The accident occurred less than 1 hour ago. The fall occurred while walking. She landed on concrete. The volume of blood lost was minimal. The point of impact was the head, left wrist and left knee. The pain is present in the head, left knee and left wrist. The pain is at a severity of 3/10. The pain is mild. She was not ambulatory at the scene. There was no entrapment after the fall. There was no drug use involved in the accident. There was no alcohol use involved in the accident. Pertinent negatives include no visual change, no numbness, no abdominal pain, no nausea, no vomiting, no headaches, no loss of consciousness and no tingling. The symptoms are aggravated by activity, standing, flexion, pressure on the injury, ambulation, extension and use of the injured limb. She has tried nothing for the symptoms.  Pt was walking her dog outside this morning when she tripped over the dog and fell forward. Pain mainly over the nose, left face, left hand, left knee. Pain with palpation over left hand and pressure on left leg. Denies headache, dizziness, LOC, visual changes, nausea, vomiting, confusion. No abdominal or chest pain.  Past Medical History  Diagnosis Date  . Hypertension   . High cholesterol   . GERD (gastroesophageal reflux disease)     Past Surgical History  Procedure Date  . Joint replacement     History reviewed. No pertinent family history.  History  Substance Use Topics  . Smoking status: Not on file  . Smokeless tobacco: Not on file  . Alcohol Use: No    OB History    Grav Para Term Preterm Abortions  TAB SAB Ect Mult Living                  Review of Systems  Constitutional: Negative.   HENT: Positive for nosebleeds. Negative for neck pain and neck stiffness.   Eyes: Negative.   Respiratory: Negative.   Cardiovascular: Negative.   Gastrointestinal: Negative.  Negative for nausea, vomiting and abdominal pain.  Musculoskeletal: Positive for joint swelling. Negative for back pain.  Skin:       bruising  Neurological: Negative for dizziness, tingling, loss of consciousness, syncope, facial asymmetry, speech difficulty, light-headedness, numbness and headaches.  Hematological: Bruises/bleeds easily.  Psychiatric/Behavioral: Negative.     Allergies  Etodolac  Home Medications   Current Outpatient Rx  Name Route Sig Dispense Refill  . AMLODIPINE BESYLATE 5 MG PO TABS Oral Take 5 mg by mouth daily.      Marland Kitchen LEVOTHYROXINE SODIUM 50 MCG PO TABS Oral Take 50 mcg by mouth daily.      . NEBIVOLOL HCL 10 MG PO TABS Oral Take 10 mg by mouth daily.      Marland Kitchen PANTOPRAZOLE SODIUM 40 MG PO TBEC Oral Take 40 mg by mouth daily.      Marland Kitchen ROSUVASTATIN CALCIUM 20 MG PO TABS Oral Take 20 mg by mouth daily.        There were no vitals taken for this visit.  Physical Exam  Constitutional: She is oriented to person, place, and time. She appears well-developed and well-nourished.  No distress.  HENT:  Head: Head is with abrasion and with contusion.  Nose: Sinus tenderness present. No nasal deformity, septal deviation or nasal septal hematoma. No epistaxis.  Mouth/Throat: Oropharynx is clear and moist and mucous membranes are normal.       Abrasion to the forehead, left maxilla, bridge and tip of the nose  Eyes: Conjunctivae and EOM are normal. Pupils are equal, round, and reactive to light.  Neck: Normal range of motion. Neck supple.  Cardiovascular: Normal rate, regular rhythm and normal heart sounds.   Pulmonary/Chest: Effort normal and breath sounds normal. No respiratory distress.  Abdominal: Soft.  Bowel sounds are normal. There is no tenderness.  Musculoskeletal:       Left wrist: She exhibits tenderness, bony tenderness, swelling and effusion. She exhibits normal range of motion.       Left knee: She exhibits decreased range of motion, swelling, effusion, ecchymosis and bony tenderness. She exhibits no deformity, no LCL laxity and no MCL laxity. tenderness found. Medial joint line, lateral joint line and patellar tendon tenderness noted. No MCL and no LCL tenderness noted.       Swelling and hematoma to the dorsal left hand. Normal elbow and wrist exam. Full ROM of all fingers, normal strength. Swelling over left patella. Limitted ROM due to pain. Joint stable.   Neurological: She is alert and oriented to person, place, and time. No cranial nerve deficit. Coordination normal.  Skin: Skin is warm and dry.  Psychiatric: She has a normal mood and affect.    ED Course  Procedures (including critical care time)  6:41 AM Pt status post mechanical fall. Neurovascularly intact, not on blood thinners. Injury to face, head, left hand, left knee. X rays and CT head/maxillofacial ordered. Pt refusing pain medications.   7:47 AM X-rays negative. CT maxillofacial showed nasal fracture. No septal hematoma or bleeding currently. Possible right orbital fx per CT scan, however, doubt since pt has no injury to right side of the face, no tenderness. Extraocular movement intact. Will d/c home. Pt requesting only tylenol for pain. Will follow up with orthopedics and ENT.  MDM          Lottie Mussel, PA 03/16/11 [phone]

## 2011-03-16 NOTE — ED Notes (Signed)
Pt with multiple abrasion to left side of face, left hand, abrasion to left knee and foot. Pt with deformity to nose.

## 2011-03-24 NOTE — ED Provider Notes (Signed)
Medical screening examination/treatment/procedure(s) were conducted as a shared visit with non-physician practitioner(s) and myself.  I personally evaluated the patient during the encounter  Suzi Roots, MD 03/24/11 1413

## 2013-05-18 IMAGING — CR DG KNEE COMPLETE 4+V*L*
4 series · 4 of 4 positions shown · non-contrast
Comparison: None.

CLINICAL DATA: Pain, bruising, and swelling secondary to a fall.

LEFT KNEE - COMPLETE 4+ VIEW

[x knee ap left (1 of 2)]
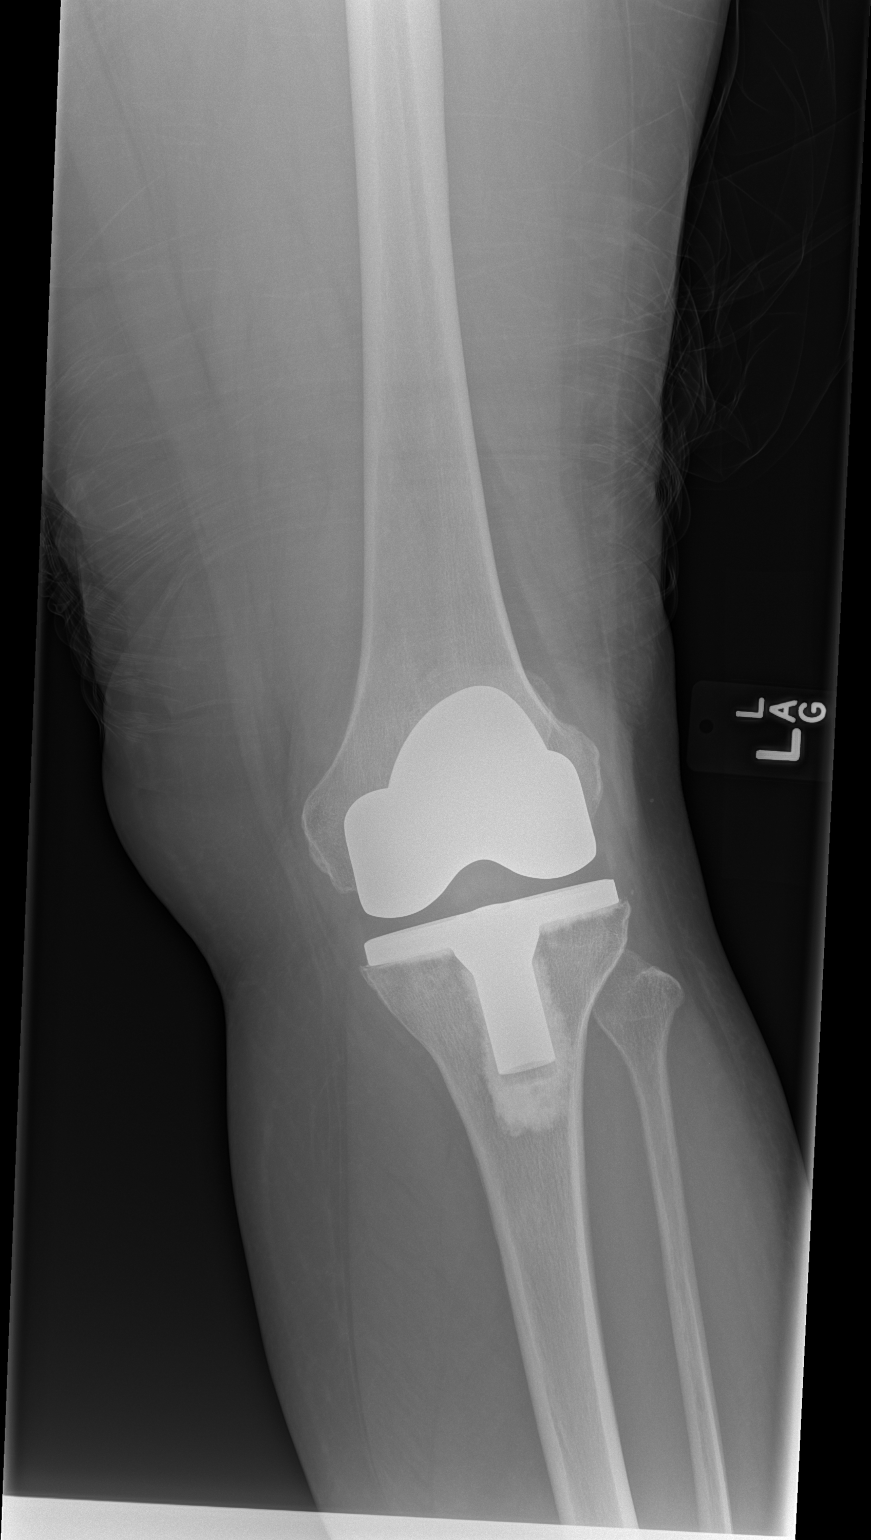

[x knee ap left (2 of 2)]
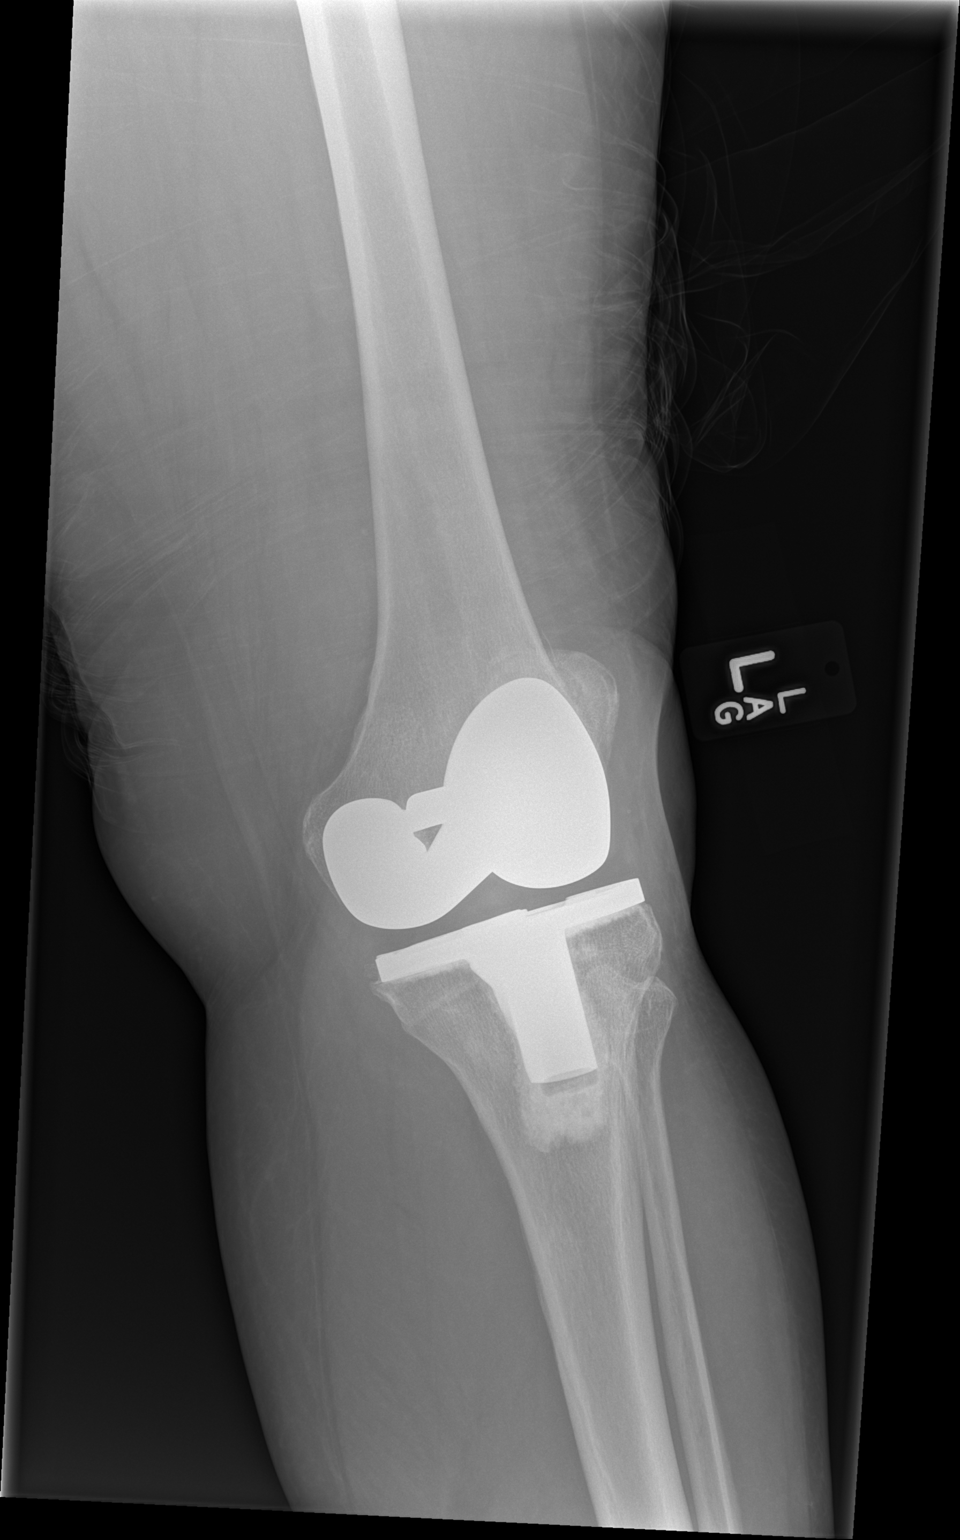

[x knee obl left]
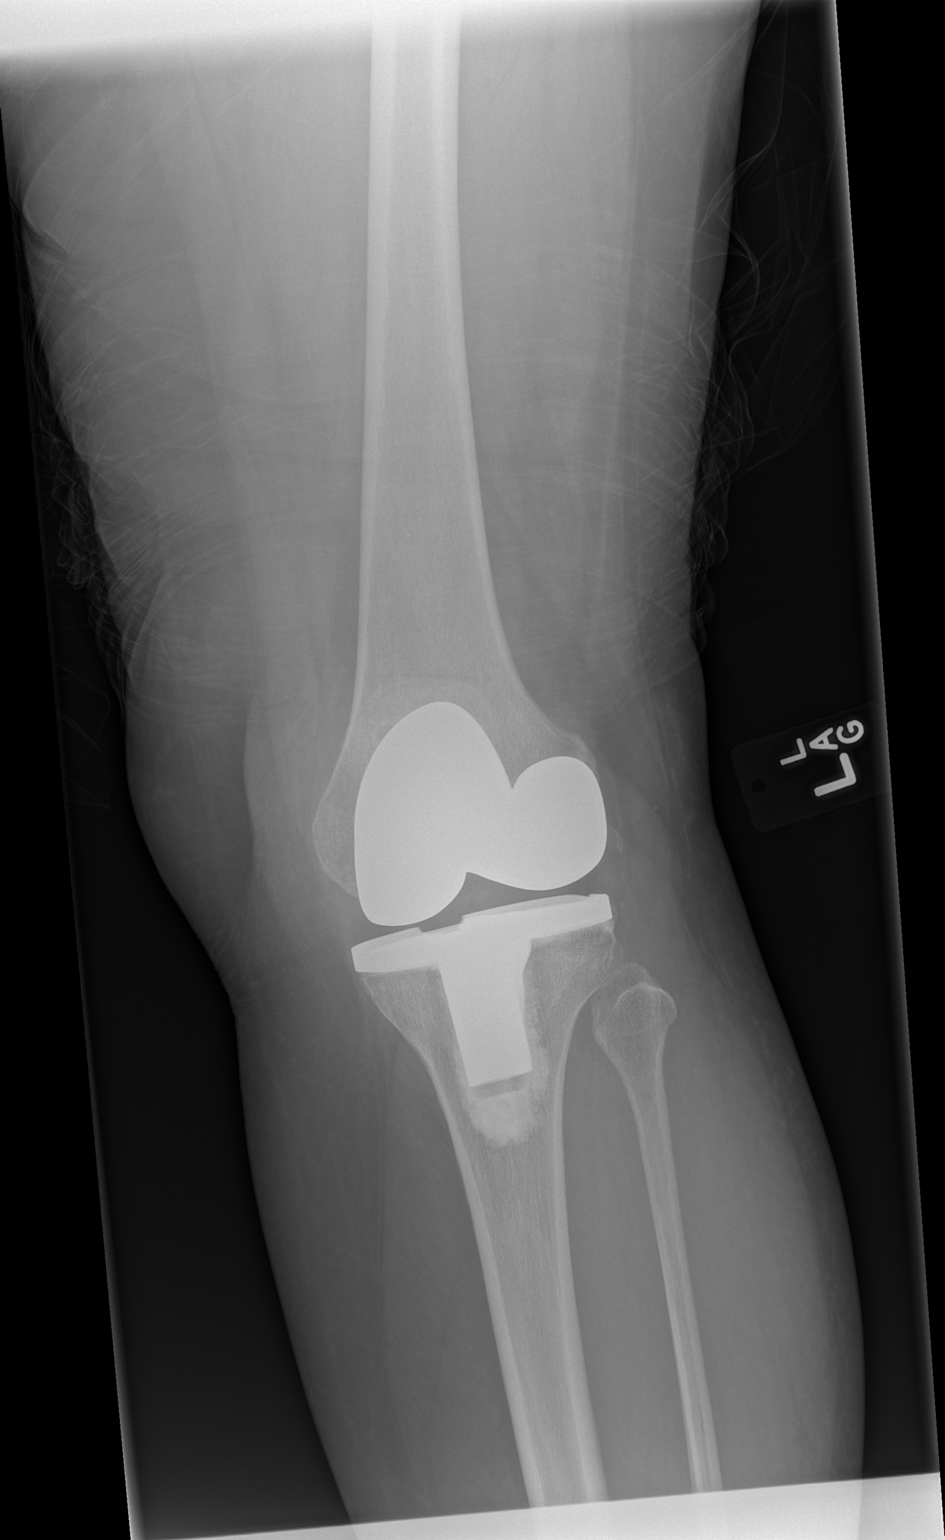

[x knee lat left]
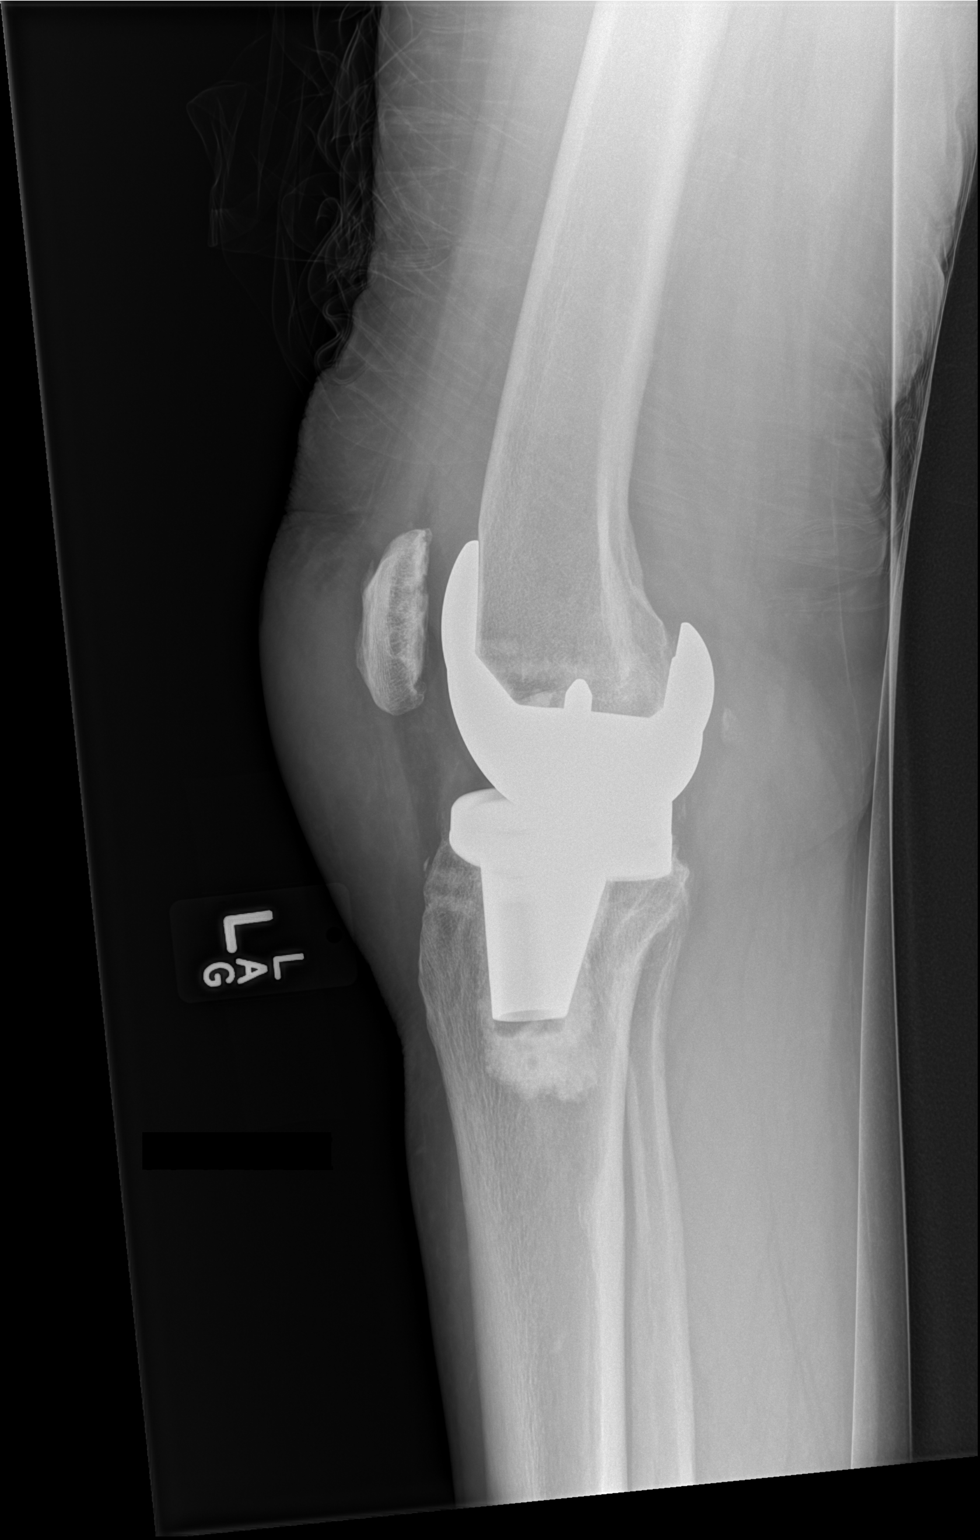

[4 of 4 positions shown; findings below may reference images not displayed]

FINDINGS: Total knee prosthesis appears in good position with no
loosening.  No fractures.  Marked soft tissue swelling anterior to
the patella and patellar tendon.
IMPRESSION: Soft tissue swelling anteriorly.  No acute osseous abnormality.

## 2013-11-22 ENCOUNTER — Encounter: Payer: Self-pay | Admitting: Gastroenterology

## 2013-12-17 ENCOUNTER — Encounter: Payer: Self-pay | Admitting: Gastroenterology

## 2014-01-10 DIAGNOSIS — M1711 Unilateral primary osteoarthritis, right knee: Secondary | ICD-10-CM

## 2014-01-10 HISTORY — DX: Unilateral primary osteoarthritis, right knee: M17.11

## 2014-02-24 ENCOUNTER — Ambulatory Visit (INDEPENDENT_AMBULATORY_CARE_PROVIDER_SITE_OTHER): Payer: Medicare Other | Admitting: Gastroenterology

## 2014-02-24 ENCOUNTER — Encounter: Payer: Self-pay | Admitting: Gastroenterology

## 2014-02-24 VITALS — BP 140/80 | HR 81 | Ht 63.0 in | Wt 192.2 lb

## 2014-02-24 DIAGNOSIS — Z86010 Personal history of colonic polyps: Secondary | ICD-10-CM

## 2014-02-24 DIAGNOSIS — Z800 Family history of malignant neoplasm of digestive organs: Secondary | ICD-10-CM

## 2014-02-24 MED ORDER — MOVIPREP 100 G PO SOLR: ORAL | Status: DC

## 2014-02-24 NOTE — Patient Instructions (Signed)
You have been scheduled for a colonoscopy. Please follow written instructions given to you at your visit today.  Please pick up your prep kit at the pharmacy within the next 1-3 days. If you use inhalers (even only as needed), please bring them with you on the day of your procedure. Your physician has requested that you go to www.startemmi.com and enter the access code given to you at your visit today. This web site gives a general overview about your procedure. However, you should still follow specific instructions given to you by our office regarding your preparation for the procedure.   I appreciate the opportunity to care for you.

## 2014-02-24 NOTE — Progress Notes (Signed)
Patient ID: Gina Waller, female   DOB: 1928/03/30, 80 y.o.   MRN: [id number]     History of Present Illness: [name] is an 80 year old female who underwent a colonoscopy in September of 2010 at which time three 5 mm sessile adenomatous polyps were removed. One was in the descending colon, one was in the proximal transverse colon, and one was in the mid transverse colon. She is due for surveillance, and comes in to discuss her need for colonoscopy. She has had no change in her bowel habits her stool caliber she's had no bloody or tarry stools. She's had no anorexia or unexplained weight loss. She is interested in having another colonoscopy in per the last time" she says she would like to know if there are any additional polyps, and goes on to say after this she will not have any further colonoscopies but since she is feeling well now she would like to have a colonoscopy.   Past Medical History  Diagnosis Date  . Hypertension   . High cholesterol   . GERD (gastroesophageal reflux disease)   . Adenomatous colon polyp   . Osteoarthritis   . Sleep apnea   . Hemorrhoids     Past Surgical History  Procedure Laterality Date  . Joint replacement    . Total knee arthroplasty     Family History  Problem Relation Age of Onset  . Colon cancer Father    History  Substance Use Topics  . Smoking status: Never Smoker   . Smokeless tobacco: Not on file  . Alcohol Use: No   Current Outpatient Prescriptions  Medication Sig Dispense Refill  . amLODipine (NORVASC) 5 MG tablet Take 5 mg by mouth daily.        . cholecalciferol (VITAMIN D) 1000 UNITS tablet Take 1,000 Units by mouth daily.        Marland Kitchen levothyroxine (SYNTHROID, LEVOTHROID) 50 MCG tablet Take 50 mcg by mouth daily.        . Multiple Vitamins-Minerals (MULTIVITAMINS THER. W/MINERALS) TABS Take 1 tablet by mouth daily.        . nebivolol (BYSTOLIC) 10 MG tablet Take 10 mg by mouth daily.        Marland Kitchen omega-3 acid ethyl esters  (LOVAZA) 1 G capsule Take 1 g by mouth daily.        . pantoprazole (PROTONIX) 40 MG tablet Take 40 mg by mouth daily.        . rosuvastatin (CRESTOR) 20 MG tablet Take 20 mg by mouth daily.        Marland Kitchen sulfaSALAzine (AZULFIDINE) 500 MG EC tablet       . telmisartan-hydrochlorothiazide (MICARDIS HCT) 80-12.5 MG per tablet Take 1 tablet by mouth daily.        . vitamin E 400 UNIT capsule Take 400 Units by mouth daily.         No current facility-administered medications for this visit.   Allergies  Allergen Reactions  . Etodolac     REACTION: stiffened arms     Review of Systems: Gen: Denies any fever, chills CV: Denies chest pain Resp: neg GI: Denies vomiting blood, jaundice, and fecal incontinence.   Denies dysphagia or odynophagia. GU : Denies urinary burning MS: Has pain in joints of hands Derm:neg Psych: neg Heme: neg Neuro: neg Endo: neg   Studies: Colonoscopy 01/23/09:  ENDOSCOPIC IMPRESSION:  1) 5 mm sessile polyp in the ascending colon  2) 5 mm sessile polyp in the proximal transverse  colon  3) 5 mm sessile polyp in the mid transverse colon  4) Internal and external hemorrhoids  RECOMMENDATIONS:  1) await pathology results  2) colonoscopy in 5 years, if health appropriate  3) No ASA/NSAIDs for 2 weeks    Physical Exam: General: Pleasant, well developed , well nourished female in no acute distress Head: Normocephalic and atraumatic Eyes:  sclerae anicteric, conjunctiva pink  Ears: Normal auditory acuity Lungs: Clear throughout to auscultation Heart: Regular rate and rhythm Abdomen: Soft, non distended, non-tender. No masses, no hepatomegaly. Normal bowel sounds Rectal: deferred Musculoskeletal: Symmetrical with no gross deformities  Extremities: No edema  Neurological: Alert oriented x 4, grossly nonfocal Psychological:  Alert and cooperative. Normal mood and affect  Assessment and Recommendations: 80 year old female with a personal history of adenomatous  colon polyps and a family history of colon cancer here to discuss surveillance colonoscopy. She is not a candidate for Cologuard and is not interested in CT colonoscopy. She clearly states she is interested in 1 more colonoscopy to be sure there are not any large polyps, and says after this colonoscopy she will not have any surveillance colonoscopies in the future. The risks, benefits, and alternatives to colonoscopy with possible biopsy and possible polypectomy were discussed with the patient and they consent to proceed.

## 2014-03-03 ENCOUNTER — Encounter: Payer: Self-pay | Admitting: Gastroenterology

## 2014-03-24 ENCOUNTER — Encounter: Payer: Self-pay | Admitting: Gastroenterology

## 2014-03-24 ENCOUNTER — Ambulatory Visit (AMBULATORY_SURGERY_CENTER): Payer: Medicare Other | Admitting: Gastroenterology

## 2014-03-24 VITALS — BP 119/71 | HR 59 | Temp 98.2°F | Resp 21 | Ht 63.0 in | Wt 192.0 lb

## 2014-03-24 DIAGNOSIS — Z800 Family history of malignant neoplasm of digestive organs: Secondary | ICD-10-CM

## 2014-03-24 DIAGNOSIS — D125 Benign neoplasm of sigmoid colon: Secondary | ICD-10-CM

## 2014-03-24 DIAGNOSIS — Z86010 Personal history of colonic polyps: Secondary | ICD-10-CM

## 2014-03-24 MED ORDER — SODIUM CHLORIDE 0.9 % IV SOLN
500.0000 mL | INTRAVENOUS | Status: DC
Start: 2014-03-24 — End: 2014-03-24

## 2014-03-24 NOTE — Patient Instructions (Signed)
Discharge instructions given with verbal understanding. Handouts on polyps,diverticulosis and hemorrhoids. Resume previous medications. YOU HAD AN ENDOSCOPIC PROCEDURE TODAY AT THE [facility]: Refer to the procedure report that was given to you for any specific questions about what was found during the examination.  If the procedure report does not answer your questions, please call your gastroenterologist to clarify.  If you requested that your care partner not be given the details of your procedure findings, then the procedure report has been included in a sealed envelope for you to review at your convenience later.  YOU SHOULD EXPECT: Some feelings of bloating in the abdomen. Passage of more gas than usual.  Walking can help get rid of the air that was put into your GI tract during the procedure and reduce the bloating. If you had a lower endoscopy (such as a colonoscopy or flexible sigmoidoscopy) you may notice spotting of blood in your stool or on the toilet paper. If you underwent a bowel prep for your procedure, then you may not have a normal bowel movement for a few days.  DIET: Your first meal following the procedure should be a light meal and then it is ok to progress to your normal diet.  A half-sandwich or bowl of soup is an example of a good first meal.  Heavy or fried foods are harder to digest and may make you feel nauseous or bloated.  Likewise meals heavy in dairy and vegetables can cause extra gas to form and this can also increase the bloating.  Drink plenty of fluids but you should avoid alcoholic beverages for 24 hours.  ACTIVITY: Your care partner should take you home directly after the procedure.  You should plan to take it easy, moving slowly for the rest of the day.  You can resume normal activity the day after the procedure however you should NOT DRIVE or use heavy machinery for 24 hours (because of the sedation medicines used during the test).    SYMPTOMS TO REPORT  IMMEDIATELY: A gastroenterologist can be reached at any hour.  During normal business hours, 8:30 AM to 5:00 PM Monday through Friday, call [phone].  After hours and on weekends, please call the GI answering service at [phone] who will take a message and have the physician on call contact you.   Following lower endoscopy (colonoscopy or flexible sigmoidoscopy):  Excessive amounts of blood in the stool  Significant tenderness or worsening of abdominal pains  Swelling of the abdomen that is new, acute  Fever of 100F or higher  FOLLOW UP: If any biopsies were taken you will be contacted by phone or by letter within the next 1-3 weeks.  Call your gastroenterologist if you have not heard about the biopsies in 3 weeks.  Our staff will call the home number listed on your records the next business day following your procedure to check on you and address any questions or concerns that you may have at that time regarding the information given to you following your procedure. This is a courtesy call and so if there is no answer at the home number and we have not heard from you through the emergency physician on call, we will assume that you have returned to your regular daily activities without incident.  SIGNATURES/CONFIDENTIALITY: You and/or your care partner have signed paperwork which will be entered into your electronic medical record.  These signatures attest to the fact that that the information above on your After Visit Summary   has been reviewed and is understood.  Full responsibility of the confidentiality of this discharge information lies with you and/or your care-partner. 

## 2014-03-24 NOTE — Op Note (Signed)
[place]  [employer]. [place], [address]   COLONOSCOPY PROCEDURE REPORT  PATIENT: Gina, Waller  MR#: [id number] BIRTHDATE: 03-Jun-1927 , 80  yrs. old GENDER: female ENDOSCOPIST: Ladene Artist, MD, [facility] PROCEDURE DATE:  03/24/2014 PROCEDURE:   Colonoscopy with snare polypectomy First Screening Colonoscopy - Avg.  risk and is 50 yrs.  old or older - No.  Prior Negative Screening - Now for repeat screening. N/A  History of Adenoma - Now for follow-up colonoscopy & has been > or = to 3 yrs.  Yes hx of adenoma.  Has been 3 or more years since last colonoscopy.  Polyps Removed Today? Yes. ASA CLASS:   Class III INDICATIONS:surveillance colonoscopy based on a history of adenomatous colonic polyp(s) and patient's immediate family history of colon cancer. MEDICATIONS: Monitored anesthesia care and Propofol 200 mg IV DESCRIPTION OF PROCEDURE:   After the risks benefits and alternatives of the procedure were thoroughly explained, informed consent was obtained.  The digital rectal exam revealed no abnormalities of the rectum.   The LB [id number]  endoscope was introduced through the anus and advanced to the cecum, which was identified by both the appendix and ileocecal valve. No adverse events experienced.   The quality of the prep was excellent, using MoviPrep  The instrument was then slowly withdrawn as the colon was fully examined.  COLON FINDINGS: A sessile polyp measuring 6 mm in size was found in the sigmoid colon.  A polypectomy was performed with a cold snare. The resection was complete, the polyp tissue was completely retrieved and sent to histology.   There was moderate diverticulosis noted in the sigmoid colon with associated muscular hypertrophy.   The examination was otherwise normal.  Retroflexed views revealed internal Grade I hemorrhoids. The time to cecum=2 minutes 23 seconds.  Withdrawal time=8 minutes 22 seconds.  The scope was  withdrawn and the procedure completed. COMPLICATIONS: There were no immediate complications.  ENDOSCOPIC IMPRESSION: 1.   Sessile polyp in the sigmoid colon; polypectomy performed with a cold snare 2.   Moderate diverticulosis in the sigmoid colon 3.    Grade l internal hemorrhoids  RECOMMENDATIONS: 1.  Await pathology results 2.  High fiber diet with liberal fluid intake. 3.  Given your age, you will not need another colonoscopy for colon cancer screening or polyp surveillance.  These types of tests usually stop around the age 54.  eSigned:  Ladene Artist, MD, [facility] 03/24/2014 2:16 PM   cc: Prince Solian, MD

## 2014-03-24 NOTE — Progress Notes (Signed)
Report to PACU, RN, vss, BBS= Clear.  

## 2014-03-24 NOTE — Progress Notes (Signed)
Called to room to assist during endoscopic procedure.  Patient ID and intended procedure confirmed with present staff. Received instructions for my participation in the procedure from the performing physician.  

## 2014-03-25 ENCOUNTER — Telehealth: Payer: Self-pay

## 2014-03-25 NOTE — Telephone Encounter (Signed)
  Follow up Call-  Call back number 03/24/2014  Post procedure Call Back phone  # [phone]  Permission to leave phone message Yes     Patient questions:  Do you have a fever, pain , or abdominal swelling? No. Pain Score  0 *  Have you tolerated food without any problems? Yes.    Have you been able to return to your normal activities? Yes.    Do you have any questions about your discharge instructions: Diet   No. Medications  No. Follow up visit  No.  Do you have questions or concerns about your Care? No.  Actions: * If pain score is 4 or above: No action needed, pain <4.  No problems per the pt. maw

## 2014-03-28 ENCOUNTER — Encounter: Payer: Self-pay | Admitting: Gastroenterology

## 2014-12-22 DIAGNOSIS — Z0000 Encounter for general adult medical examination without abnormal findings: Secondary | ICD-10-CM | POA: Insufficient documentation

## 2015-03-23 ENCOUNTER — Encounter: Payer: Self-pay | Admitting: Gastroenterology

## 2015-06-22 DIAGNOSIS — E784 Other hyperlipidemia: Secondary | ICD-10-CM | POA: Diagnosis not present

## 2015-06-22 DIAGNOSIS — M069 Rheumatoid arthritis, unspecified: Secondary | ICD-10-CM | POA: Diagnosis not present

## 2015-06-22 DIAGNOSIS — E038 Other specified hypothyroidism: Secondary | ICD-10-CM | POA: Diagnosis not present

## 2015-06-22 DIAGNOSIS — F430 Acute stress reaction: Secondary | ICD-10-CM | POA: Diagnosis not present

## 2015-06-22 DIAGNOSIS — I10 Essential (primary) hypertension: Secondary | ICD-10-CM | POA: Diagnosis not present

## 2015-06-22 DIAGNOSIS — Z6832 Body mass index (BMI) 32.0-32.9, adult: Secondary | ICD-10-CM | POA: Diagnosis not present

## 2015-06-22 DIAGNOSIS — M1990 Unspecified osteoarthritis, unspecified site: Secondary | ICD-10-CM | POA: Diagnosis not present

## 2015-06-22 DIAGNOSIS — G4733 Obstructive sleep apnea (adult) (pediatric): Secondary | ICD-10-CM | POA: Diagnosis not present

## 2015-06-22 DIAGNOSIS — Z1389 Encounter for screening for other disorder: Secondary | ICD-10-CM | POA: Diagnosis not present

## 2015-06-22 DIAGNOSIS — N3281 Overactive bladder: Secondary | ICD-10-CM | POA: Diagnosis not present

## 2015-09-09 DIAGNOSIS — G4733 Obstructive sleep apnea (adult) (pediatric): Secondary | ICD-10-CM | POA: Diagnosis not present

## 2015-09-09 DIAGNOSIS — Z6833 Body mass index (BMI) 33.0-33.9, adult: Secondary | ICD-10-CM | POA: Diagnosis not present

## 2015-09-09 DIAGNOSIS — F430 Acute stress reaction: Secondary | ICD-10-CM | POA: Diagnosis not present

## 2015-09-09 DIAGNOSIS — M1990 Unspecified osteoarthritis, unspecified site: Secondary | ICD-10-CM | POA: Diagnosis not present

## 2015-09-09 DIAGNOSIS — E038 Other specified hypothyroidism: Secondary | ICD-10-CM | POA: Diagnosis not present

## 2015-09-09 DIAGNOSIS — I10 Essential (primary) hypertension: Secondary | ICD-10-CM | POA: Diagnosis not present

## 2015-09-15 DIAGNOSIS — G4733 Obstructive sleep apnea (adult) (pediatric): Secondary | ICD-10-CM | POA: Diagnosis not present

## 2015-12-14 DIAGNOSIS — Z1231 Encounter for screening mammogram for malignant neoplasm of breast: Secondary | ICD-10-CM | POA: Diagnosis not present

## 2016-01-12 DIAGNOSIS — N390 Urinary tract infection, site not specified: Secondary | ICD-10-CM | POA: Diagnosis not present

## 2016-01-12 DIAGNOSIS — I10 Essential (primary) hypertension: Secondary | ICD-10-CM | POA: Diagnosis not present

## 2016-01-12 DIAGNOSIS — R8299 Other abnormal findings in urine: Secondary | ICD-10-CM | POA: Diagnosis not present

## 2016-01-12 DIAGNOSIS — E038 Other specified hypothyroidism: Secondary | ICD-10-CM | POA: Diagnosis not present

## 2016-01-12 DIAGNOSIS — E784 Other hyperlipidemia: Secondary | ICD-10-CM | POA: Diagnosis not present

## 2016-01-12 DIAGNOSIS — M859 Disorder of bone density and structure, unspecified: Secondary | ICD-10-CM | POA: Diagnosis not present

## 2016-01-18 DIAGNOSIS — G459 Transient cerebral ischemic attack, unspecified: Secondary | ICD-10-CM | POA: Diagnosis not present

## 2016-01-18 DIAGNOSIS — I440 Atrioventricular block, first degree: Secondary | ICD-10-CM | POA: Diagnosis not present

## 2016-01-18 DIAGNOSIS — M069 Rheumatoid arthritis, unspecified: Secondary | ICD-10-CM | POA: Diagnosis not present

## 2016-01-18 DIAGNOSIS — F430 Acute stress reaction: Secondary | ICD-10-CM | POA: Diagnosis not present

## 2016-01-18 DIAGNOSIS — E784 Other hyperlipidemia: Secondary | ICD-10-CM | POA: Diagnosis not present

## 2016-01-18 DIAGNOSIS — Z0000 Encounter for general adult medical examination without abnormal findings: Secondary | ICD-10-CM | POA: Diagnosis not present

## 2016-01-18 DIAGNOSIS — M1990 Unspecified osteoarthritis, unspecified site: Secondary | ICD-10-CM | POA: Diagnosis not present

## 2016-01-18 DIAGNOSIS — Z23 Encounter for immunization: Secondary | ICD-10-CM | POA: Diagnosis not present

## 2016-01-18 DIAGNOSIS — N3281 Overactive bladder: Secondary | ICD-10-CM | POA: Diagnosis not present

## 2016-01-18 DIAGNOSIS — E669 Obesity, unspecified: Secondary | ICD-10-CM | POA: Diagnosis not present

## 2016-01-18 DIAGNOSIS — K449 Diaphragmatic hernia without obstruction or gangrene: Secondary | ICD-10-CM | POA: Diagnosis not present

## 2016-01-18 DIAGNOSIS — Z1389 Encounter for screening for other disorder: Secondary | ICD-10-CM | POA: Diagnosis not present

## 2016-03-04 DIAGNOSIS — H68001 Unspecified Eustachian salpingitis, right ear: Secondary | ICD-10-CM | POA: Diagnosis not present

## 2016-03-04 DIAGNOSIS — H60501 Unspecified acute noninfective otitis externa, right ear: Secondary | ICD-10-CM | POA: Diagnosis not present

## 2016-03-04 DIAGNOSIS — Z6834 Body mass index (BMI) 34.0-34.9, adult: Secondary | ICD-10-CM | POA: Diagnosis not present

## 2016-03-04 DIAGNOSIS — R5383 Other fatigue: Secondary | ICD-10-CM | POA: Diagnosis not present

## 2016-03-24 DIAGNOSIS — G4733 Obstructive sleep apnea (adult) (pediatric): Secondary | ICD-10-CM | POA: Diagnosis not present

## 2016-08-03 DIAGNOSIS — Z6833 Body mass index (BMI) 33.0-33.9, adult: Secondary | ICD-10-CM | POA: Diagnosis not present

## 2016-08-03 DIAGNOSIS — Z1389 Encounter for screening for other disorder: Secondary | ICD-10-CM | POA: Diagnosis not present

## 2016-08-03 DIAGNOSIS — F430 Acute stress reaction: Secondary | ICD-10-CM | POA: Diagnosis not present

## 2016-08-03 DIAGNOSIS — M069 Rheumatoid arthritis, unspecified: Secondary | ICD-10-CM | POA: Diagnosis not present

## 2016-08-03 DIAGNOSIS — I10 Essential (primary) hypertension: Secondary | ICD-10-CM | POA: Diagnosis not present

## 2016-08-03 DIAGNOSIS — E784 Other hyperlipidemia: Secondary | ICD-10-CM | POA: Diagnosis not present

## 2016-08-03 DIAGNOSIS — M25572 Pain in left ankle and joints of left foot: Secondary | ICD-10-CM | POA: Diagnosis not present

## 2016-08-03 DIAGNOSIS — E038 Other specified hypothyroidism: Secondary | ICD-10-CM | POA: Diagnosis not present

## 2016-08-03 DIAGNOSIS — G4733 Obstructive sleep apnea (adult) (pediatric): Secondary | ICD-10-CM | POA: Diagnosis not present

## 2016-08-16 DIAGNOSIS — M25572 Pain in left ankle and joints of left foot: Secondary | ICD-10-CM | POA: Diagnosis not present

## 2016-08-16 DIAGNOSIS — G8929 Other chronic pain: Secondary | ICD-10-CM | POA: Diagnosis not present

## 2016-09-03 DIAGNOSIS — G8929 Other chronic pain: Secondary | ICD-10-CM | POA: Diagnosis not present

## 2016-09-03 DIAGNOSIS — M25572 Pain in left ankle and joints of left foot: Secondary | ICD-10-CM | POA: Diagnosis not present

## 2016-09-27 DIAGNOSIS — M25572 Pain in left ankle and joints of left foot: Secondary | ICD-10-CM | POA: Diagnosis not present

## 2016-09-27 DIAGNOSIS — G8929 Other chronic pain: Secondary | ICD-10-CM | POA: Diagnosis not present

## 2016-10-25 DIAGNOSIS — M25572 Pain in left ankle and joints of left foot: Secondary | ICD-10-CM | POA: Diagnosis not present

## 2016-10-25 DIAGNOSIS — M79672 Pain in left foot: Secondary | ICD-10-CM | POA: Diagnosis not present

## 2016-10-25 DIAGNOSIS — G8929 Other chronic pain: Secondary | ICD-10-CM | POA: Diagnosis not present

## 2016-12-14 DIAGNOSIS — Z1231 Encounter for screening mammogram for malignant neoplasm of breast: Secondary | ICD-10-CM | POA: Diagnosis not present

## 2016-12-30 DIAGNOSIS — G4733 Obstructive sleep apnea (adult) (pediatric): Secondary | ICD-10-CM | POA: Diagnosis not present

## 2017-02-15 DIAGNOSIS — I10 Essential (primary) hypertension: Secondary | ICD-10-CM | POA: Diagnosis not present

## 2017-02-15 DIAGNOSIS — M859 Disorder of bone density and structure, unspecified: Secondary | ICD-10-CM | POA: Diagnosis not present

## 2017-02-15 DIAGNOSIS — E7849 Other hyperlipidemia: Secondary | ICD-10-CM | POA: Diagnosis not present

## 2017-02-15 DIAGNOSIS — E038 Other specified hypothyroidism: Secondary | ICD-10-CM | POA: Diagnosis not present

## 2017-02-22 DIAGNOSIS — M48061 Spinal stenosis, lumbar region without neurogenic claudication: Secondary | ICD-10-CM | POA: Diagnosis not present

## 2017-02-22 DIAGNOSIS — R0789 Other chest pain: Secondary | ICD-10-CM | POA: Diagnosis not present

## 2017-02-22 DIAGNOSIS — K449 Diaphragmatic hernia without obstruction or gangrene: Secondary | ICD-10-CM | POA: Diagnosis not present

## 2017-02-22 DIAGNOSIS — Z0000 Encounter for general adult medical examination without abnormal findings: Secondary | ICD-10-CM | POA: Diagnosis not present

## 2017-02-22 DIAGNOSIS — E038 Other specified hypothyroidism: Secondary | ICD-10-CM | POA: Diagnosis not present

## 2017-02-22 DIAGNOSIS — Z6833 Body mass index (BMI) 33.0-33.9, adult: Secondary | ICD-10-CM | POA: Diagnosis not present

## 2017-02-22 DIAGNOSIS — E7849 Other hyperlipidemia: Secondary | ICD-10-CM | POA: Diagnosis not present

## 2017-02-22 DIAGNOSIS — I10 Essential (primary) hypertension: Secondary | ICD-10-CM | POA: Diagnosis not present

## 2017-02-22 DIAGNOSIS — E668 Other obesity: Secondary | ICD-10-CM | POA: Diagnosis not present

## 2017-02-22 DIAGNOSIS — M859 Disorder of bone density and structure, unspecified: Secondary | ICD-10-CM | POA: Diagnosis not present

## 2017-02-22 DIAGNOSIS — G4733 Obstructive sleep apnea (adult) (pediatric): Secondary | ICD-10-CM | POA: Diagnosis not present

## 2017-02-22 DIAGNOSIS — G458 Other transient cerebral ischemic attacks and related syndromes: Secondary | ICD-10-CM | POA: Diagnosis not present

## 2017-02-27 DIAGNOSIS — Z1212 Encounter for screening for malignant neoplasm of rectum: Secondary | ICD-10-CM | POA: Diagnosis not present

## 2017-04-12 DIAGNOSIS — G4733 Obstructive sleep apnea (adult) (pediatric): Secondary | ICD-10-CM | POA: Diagnosis not present

## 2017-05-13 DIAGNOSIS — G4733 Obstructive sleep apnea (adult) (pediatric): Secondary | ICD-10-CM | POA: Diagnosis not present

## 2017-06-13 DIAGNOSIS — G4733 Obstructive sleep apnea (adult) (pediatric): Secondary | ICD-10-CM | POA: Diagnosis not present

## 2017-07-11 DIAGNOSIS — G4733 Obstructive sleep apnea (adult) (pediatric): Secondary | ICD-10-CM | POA: Diagnosis not present

## 2017-08-11 DIAGNOSIS — G4733 Obstructive sleep apnea (adult) (pediatric): Secondary | ICD-10-CM | POA: Diagnosis not present

## 2017-08-23 DIAGNOSIS — G4733 Obstructive sleep apnea (adult) (pediatric): Secondary | ICD-10-CM | POA: Diagnosis not present

## 2017-08-23 DIAGNOSIS — I10 Essential (primary) hypertension: Secondary | ICD-10-CM | POA: Diagnosis not present

## 2017-08-23 DIAGNOSIS — Z6833 Body mass index (BMI) 33.0-33.9, adult: Secondary | ICD-10-CM | POA: Diagnosis not present

## 2017-08-23 DIAGNOSIS — R0789 Other chest pain: Secondary | ICD-10-CM | POA: Diagnosis not present

## 2017-08-23 DIAGNOSIS — M8580 Other specified disorders of bone density and structure, unspecified site: Secondary | ICD-10-CM | POA: Diagnosis not present

## 2017-08-23 DIAGNOSIS — M1990 Unspecified osteoarthritis, unspecified site: Secondary | ICD-10-CM | POA: Diagnosis not present

## 2017-08-23 DIAGNOSIS — K449 Diaphragmatic hernia without obstruction or gangrene: Secondary | ICD-10-CM | POA: Diagnosis not present

## 2017-08-23 DIAGNOSIS — E669 Obesity, unspecified: Secondary | ICD-10-CM | POA: Diagnosis not present

## 2017-09-10 DIAGNOSIS — G4733 Obstructive sleep apnea (adult) (pediatric): Secondary | ICD-10-CM | POA: Diagnosis not present

## 2017-10-04 DIAGNOSIS — R279 Unspecified lack of coordination: Secondary | ICD-10-CM | POA: Diagnosis not present

## 2017-10-04 DIAGNOSIS — J302 Other seasonal allergic rhinitis: Secondary | ICD-10-CM | POA: Diagnosis not present

## 2017-10-04 DIAGNOSIS — R42 Dizziness and giddiness: Secondary | ICD-10-CM | POA: Diagnosis not present

## 2017-10-04 DIAGNOSIS — I10 Essential (primary) hypertension: Secondary | ICD-10-CM | POA: Diagnosis not present

## 2017-10-04 DIAGNOSIS — L989 Disorder of the skin and subcutaneous tissue, unspecified: Secondary | ICD-10-CM | POA: Diagnosis not present

## 2017-10-04 DIAGNOSIS — M859 Disorder of bone density and structure, unspecified: Secondary | ICD-10-CM | POA: Diagnosis not present

## 2017-10-04 DIAGNOSIS — E038 Other specified hypothyroidism: Secondary | ICD-10-CM | POA: Diagnosis not present

## 2017-10-04 DIAGNOSIS — G459 Transient cerebral ischemic attack, unspecified: Secondary | ICD-10-CM | POA: Diagnosis not present

## 2017-10-04 DIAGNOSIS — Z6833 Body mass index (BMI) 33.0-33.9, adult: Secondary | ICD-10-CM | POA: Diagnosis not present

## 2017-10-04 HISTORY — DX: Unspecified lack of coordination: R27.9

## 2017-10-04 HISTORY — DX: Disorder of the skin and subcutaneous tissue, unspecified: L98.9

## 2017-10-04 HISTORY — DX: Dizziness and giddiness: R42

## 2017-10-11 DIAGNOSIS — G4733 Obstructive sleep apnea (adult) (pediatric): Secondary | ICD-10-CM | POA: Diagnosis not present

## 2017-10-18 DIAGNOSIS — E038 Other specified hypothyroidism: Secondary | ICD-10-CM | POA: Diagnosis not present

## 2017-10-18 DIAGNOSIS — Z6833 Body mass index (BMI) 33.0-33.9, adult: Secondary | ICD-10-CM | POA: Diagnosis not present

## 2017-10-18 DIAGNOSIS — J302 Other seasonal allergic rhinitis: Secondary | ICD-10-CM | POA: Diagnosis not present

## 2017-10-18 DIAGNOSIS — R42 Dizziness and giddiness: Secondary | ICD-10-CM | POA: Diagnosis not present

## 2017-10-18 DIAGNOSIS — I10 Essential (primary) hypertension: Secondary | ICD-10-CM | POA: Diagnosis not present

## 2017-10-18 DIAGNOSIS — M1990 Unspecified osteoarthritis, unspecified site: Secondary | ICD-10-CM | POA: Diagnosis not present

## 2017-10-18 DIAGNOSIS — R279 Unspecified lack of coordination: Secondary | ICD-10-CM | POA: Diagnosis not present

## 2017-11-07 DIAGNOSIS — Z85828 Personal history of other malignant neoplasm of skin: Secondary | ICD-10-CM | POA: Diagnosis not present

## 2017-11-07 DIAGNOSIS — D1801 Hemangioma of skin and subcutaneous tissue: Secondary | ICD-10-CM | POA: Diagnosis not present

## 2017-11-07 DIAGNOSIS — D485 Neoplasm of uncertain behavior of skin: Secondary | ICD-10-CM | POA: Diagnosis not present

## 2017-11-07 DIAGNOSIS — L812 Freckles: Secondary | ICD-10-CM | POA: Diagnosis not present

## 2017-11-07 DIAGNOSIS — L820 Inflamed seborrheic keratosis: Secondary | ICD-10-CM | POA: Diagnosis not present

## 2017-11-07 DIAGNOSIS — L821 Other seborrheic keratosis: Secondary | ICD-10-CM | POA: Diagnosis not present

## 2017-11-10 DIAGNOSIS — G4733 Obstructive sleep apnea (adult) (pediatric): Secondary | ICD-10-CM | POA: Diagnosis not present

## 2017-12-11 DIAGNOSIS — G4733 Obstructive sleep apnea (adult) (pediatric): Secondary | ICD-10-CM | POA: Diagnosis not present

## 2017-12-15 DIAGNOSIS — Z1231 Encounter for screening mammogram for malignant neoplasm of breast: Secondary | ICD-10-CM | POA: Diagnosis not present

## 2018-01-11 DIAGNOSIS — G4733 Obstructive sleep apnea (adult) (pediatric): Secondary | ICD-10-CM | POA: Diagnosis not present

## 2018-02-10 DIAGNOSIS — G4733 Obstructive sleep apnea (adult) (pediatric): Secondary | ICD-10-CM | POA: Diagnosis not present

## 2018-02-21 DIAGNOSIS — M859 Disorder of bone density and structure, unspecified: Secondary | ICD-10-CM | POA: Diagnosis not present

## 2018-02-21 DIAGNOSIS — E7849 Other hyperlipidemia: Secondary | ICD-10-CM | POA: Diagnosis not present

## 2018-02-21 DIAGNOSIS — E038 Other specified hypothyroidism: Secondary | ICD-10-CM | POA: Diagnosis not present

## 2018-02-21 DIAGNOSIS — I10 Essential (primary) hypertension: Secondary | ICD-10-CM | POA: Diagnosis not present

## 2018-02-28 DIAGNOSIS — Z0000 Encounter for general adult medical examination without abnormal findings: Secondary | ICD-10-CM | POA: Diagnosis not present

## 2018-02-28 DIAGNOSIS — E038 Other specified hypothyroidism: Secondary | ICD-10-CM | POA: Diagnosis not present

## 2018-02-28 DIAGNOSIS — G4733 Obstructive sleep apnea (adult) (pediatric): Secondary | ICD-10-CM | POA: Diagnosis not present

## 2018-02-28 DIAGNOSIS — I10 Essential (primary) hypertension: Secondary | ICD-10-CM | POA: Diagnosis not present

## 2018-02-28 DIAGNOSIS — Z1389 Encounter for screening for other disorder: Secondary | ICD-10-CM | POA: Diagnosis not present

## 2018-02-28 DIAGNOSIS — E7849 Other hyperlipidemia: Secondary | ICD-10-CM | POA: Diagnosis not present

## 2018-02-28 DIAGNOSIS — K449 Diaphragmatic hernia without obstruction or gangrene: Secondary | ICD-10-CM | POA: Diagnosis not present

## 2018-02-28 DIAGNOSIS — G4709 Other insomnia: Secondary | ICD-10-CM | POA: Diagnosis not present

## 2018-02-28 DIAGNOSIS — M859 Disorder of bone density and structure, unspecified: Secondary | ICD-10-CM | POA: Diagnosis not present

## 2018-02-28 DIAGNOSIS — G459 Transient cerebral ischemic attack, unspecified: Secondary | ICD-10-CM | POA: Diagnosis not present

## 2018-02-28 DIAGNOSIS — Z6832 Body mass index (BMI) 32.0-32.9, adult: Secondary | ICD-10-CM | POA: Diagnosis not present

## 2018-02-28 DIAGNOSIS — E668 Other obesity: Secondary | ICD-10-CM | POA: Diagnosis not present

## 2018-02-28 DIAGNOSIS — G4700 Insomnia, unspecified: Secondary | ICD-10-CM

## 2018-02-28 HISTORY — DX: Insomnia, unspecified: G47.00

## 2018-03-06 DIAGNOSIS — G4733 Obstructive sleep apnea (adult) (pediatric): Secondary | ICD-10-CM | POA: Diagnosis not present

## 2018-03-07 DIAGNOSIS — G4733 Obstructive sleep apnea (adult) (pediatric): Secondary | ICD-10-CM | POA: Diagnosis not present

## 2018-03-13 DIAGNOSIS — Z1212 Encounter for screening for malignant neoplasm of rectum: Secondary | ICD-10-CM | POA: Diagnosis not present

## 2018-03-13 DIAGNOSIS — G4733 Obstructive sleep apnea (adult) (pediatric): Secondary | ICD-10-CM | POA: Diagnosis not present

## 2018-04-06 DIAGNOSIS — G4733 Obstructive sleep apnea (adult) (pediatric): Secondary | ICD-10-CM | POA: Diagnosis not present

## 2018-05-06 DIAGNOSIS — G4733 Obstructive sleep apnea (adult) (pediatric): Secondary | ICD-10-CM | POA: Diagnosis not present

## 2018-06-06 DIAGNOSIS — G4733 Obstructive sleep apnea (adult) (pediatric): Secondary | ICD-10-CM | POA: Diagnosis not present

## 2018-07-07 DIAGNOSIS — G4733 Obstructive sleep apnea (adult) (pediatric): Secondary | ICD-10-CM | POA: Diagnosis not present

## 2018-08-05 DIAGNOSIS — G4733 Obstructive sleep apnea (adult) (pediatric): Secondary | ICD-10-CM | POA: Diagnosis not present

## 2018-09-05 DIAGNOSIS — G4733 Obstructive sleep apnea (adult) (pediatric): Secondary | ICD-10-CM | POA: Diagnosis not present

## 2018-10-05 DIAGNOSIS — G4733 Obstructive sleep apnea (adult) (pediatric): Secondary | ICD-10-CM | POA: Diagnosis not present

## 2018-11-05 DIAGNOSIS — G4733 Obstructive sleep apnea (adult) (pediatric): Secondary | ICD-10-CM | POA: Diagnosis not present

## 2018-11-16 DIAGNOSIS — H52203 Unspecified astigmatism, bilateral: Secondary | ICD-10-CM | POA: Diagnosis not present

## 2018-11-16 DIAGNOSIS — Z961 Presence of intraocular lens: Secondary | ICD-10-CM | POA: Diagnosis not present

## 2018-11-16 DIAGNOSIS — H524 Presbyopia: Secondary | ICD-10-CM | POA: Diagnosis not present

## 2018-12-05 DIAGNOSIS — G4733 Obstructive sleep apnea (adult) (pediatric): Secondary | ICD-10-CM | POA: Diagnosis not present

## 2018-12-28 DIAGNOSIS — Z1231 Encounter for screening mammogram for malignant neoplasm of breast: Secondary | ICD-10-CM | POA: Diagnosis not present

## 2019-03-11 DIAGNOSIS — E7849 Other hyperlipidemia: Secondary | ICD-10-CM | POA: Diagnosis not present

## 2019-03-11 DIAGNOSIS — M859 Disorder of bone density and structure, unspecified: Secondary | ICD-10-CM | POA: Diagnosis not present

## 2019-03-18 DIAGNOSIS — G459 Transient cerebral ischemic attack, unspecified: Secondary | ICD-10-CM | POA: Diagnosis not present

## 2019-03-18 DIAGNOSIS — M8580 Other specified disorders of bone density and structure, unspecified site: Secondary | ICD-10-CM | POA: Diagnosis not present

## 2019-03-18 DIAGNOSIS — M542 Cervicalgia: Secondary | ICD-10-CM | POA: Insufficient documentation

## 2019-03-18 DIAGNOSIS — R82998 Other abnormal findings in urine: Secondary | ICD-10-CM | POA: Diagnosis not present

## 2019-03-18 DIAGNOSIS — E785 Hyperlipidemia, unspecified: Secondary | ICD-10-CM | POA: Diagnosis not present

## 2019-03-18 DIAGNOSIS — M48061 Spinal stenosis, lumbar region without neurogenic claudication: Secondary | ICD-10-CM | POA: Diagnosis not present

## 2019-03-18 DIAGNOSIS — Z1331 Encounter for screening for depression: Secondary | ICD-10-CM | POA: Diagnosis not present

## 2019-03-18 DIAGNOSIS — J302 Other seasonal allergic rhinitis: Secondary | ICD-10-CM | POA: Diagnosis not present

## 2019-03-18 DIAGNOSIS — G4733 Obstructive sleep apnea (adult) (pediatric): Secondary | ICD-10-CM | POA: Diagnosis not present

## 2019-03-18 DIAGNOSIS — K449 Diaphragmatic hernia without obstruction or gangrene: Secondary | ICD-10-CM | POA: Diagnosis not present

## 2019-03-18 DIAGNOSIS — E669 Obesity, unspecified: Secondary | ICD-10-CM | POA: Diagnosis not present

## 2019-03-18 DIAGNOSIS — I10 Essential (primary) hypertension: Secondary | ICD-10-CM | POA: Diagnosis not present

## 2019-03-18 DIAGNOSIS — E039 Hypothyroidism, unspecified: Secondary | ICD-10-CM | POA: Diagnosis not present

## 2019-03-18 DIAGNOSIS — Z0000 Encounter for general adult medical examination without abnormal findings: Secondary | ICD-10-CM | POA: Diagnosis not present

## 2019-03-18 HISTORY — DX: Cervicalgia: M54.2

## 2019-05-10 DIAGNOSIS — C44519 Basal cell carcinoma of skin of other part of trunk: Secondary | ICD-10-CM

## 2019-05-10 HISTORY — DX: Basal cell carcinoma of skin of other part of trunk: C44.519

## 2019-06-24 ENCOUNTER — Ambulatory Visit: Payer: PPO | Attending: Internal Medicine

## 2019-06-24 ENCOUNTER — Other Ambulatory Visit: Payer: Self-pay

## 2019-06-24 DIAGNOSIS — Z23 Encounter for immunization: Secondary | ICD-10-CM | POA: Insufficient documentation

## 2019-06-24 NOTE — Progress Notes (Signed)
   Covid-19 Vaccination Clinic  Name:  Gina Waller    MRN: [id number] DOB: 11/09/1927  06/24/2019  Gina Waller was observed post Covid-19 immunization for 15 minutes without incidence. She was provided with Vaccine Information Sheet and instruction to access the V-Safe system.   Gina Waller was instructed to call 911 with any severe reactions post vaccine: Marland Kitchen Difficulty breathing  . Swelling of your face and throat  . A fast heartbeat  . A bad rash all over your body  . Dizziness and weakness    Immunizations Administered    Name Date Dose VIS Date Route   Moderna COVID-19 Vaccine 06/24/2019 12:38 PM 0.5 mL 04/09/2019 Intramuscular   Manufacturer: Moderna   Lot: [id number]   [id number]

## 2019-07-23 ENCOUNTER — Ambulatory Visit: Payer: PPO | Attending: Internal Medicine

## 2019-07-23 DIAGNOSIS — Z23 Encounter for immunization: Secondary | ICD-10-CM

## 2019-07-23 NOTE — Progress Notes (Signed)
   Covid-19 Vaccination Clinic  Name:  Gina Waller    MRN: [id number] DOB: [birth year]  07/23/2019  Gina Waller was observed post Covid-19 immunization for 15 minutes without incident. She was provided with Vaccine Information Sheet and instruction to access the V-Safe system.   Gina Waller was instructed to call 911 with any severe reactions post vaccine: Marland Kitchen Difficulty breathing  . Swelling of face and throat  . A fast heartbeat  . A bad rash all over body  . Dizziness and weakness   Immunizations Administered    Name Date Dose VIS Date Route   Moderna COVID-19 Vaccine 07/23/2019 12:43 PM 0.5 mL 04/09/2019 Intramuscular   Manufacturer: Moderna   Lot: [id number]   [place][id number]

## 2019-09-24 DIAGNOSIS — L820 Inflamed seborrheic keratosis: Secondary | ICD-10-CM | POA: Diagnosis not present

## 2019-09-24 DIAGNOSIS — L821 Other seborrheic keratosis: Secondary | ICD-10-CM | POA: Diagnosis not present

## 2019-09-24 DIAGNOSIS — L409 Psoriasis, unspecified: Secondary | ICD-10-CM | POA: Diagnosis not present

## 2019-09-24 DIAGNOSIS — D1801 Hemangioma of skin and subcutaneous tissue: Secondary | ICD-10-CM | POA: Diagnosis not present

## 2019-11-04 DIAGNOSIS — R059 Cough, unspecified: Secondary | ICD-10-CM | POA: Insufficient documentation

## 2019-11-04 DIAGNOSIS — R05 Cough: Secondary | ICD-10-CM | POA: Diagnosis not present

## 2019-11-04 DIAGNOSIS — I10 Essential (primary) hypertension: Secondary | ICD-10-CM | POA: Diagnosis not present

## 2019-11-04 DIAGNOSIS — J302 Other seasonal allergic rhinitis: Secondary | ICD-10-CM | POA: Diagnosis not present

## 2019-11-04 DIAGNOSIS — M542 Cervicalgia: Secondary | ICD-10-CM | POA: Diagnosis not present

## 2019-11-04 DIAGNOSIS — G4733 Obstructive sleep apnea (adult) (pediatric): Secondary | ICD-10-CM | POA: Diagnosis not present

## 2019-11-04 HISTORY — DX: Cough, unspecified: R05.9

## 2020-01-03 DIAGNOSIS — Z1231 Encounter for screening mammogram for malignant neoplasm of breast: Secondary | ICD-10-CM | POA: Diagnosis not present

## 2020-01-16 DIAGNOSIS — M21061 Valgus deformity, not elsewhere classified, right knee: Secondary | ICD-10-CM | POA: Diagnosis not present

## 2020-01-16 DIAGNOSIS — M1712 Unilateral primary osteoarthritis, left knee: Secondary | ICD-10-CM | POA: Diagnosis not present

## 2020-01-16 DIAGNOSIS — M1711 Unilateral primary osteoarthritis, right knee: Secondary | ICD-10-CM | POA: Diagnosis not present

## 2020-01-16 DIAGNOSIS — Z96652 Presence of left artificial knee joint: Secondary | ICD-10-CM | POA: Diagnosis not present

## 2020-01-22 DIAGNOSIS — M1711 Unilateral primary osteoarthritis, right knee: Secondary | ICD-10-CM | POA: Diagnosis not present

## 2020-01-22 DIAGNOSIS — Z01818 Encounter for other preprocedural examination: Secondary | ICD-10-CM | POA: Diagnosis not present

## 2020-01-22 DIAGNOSIS — E039 Hypothyroidism, unspecified: Secondary | ICD-10-CM | POA: Diagnosis not present

## 2020-01-22 DIAGNOSIS — I10 Essential (primary) hypertension: Secondary | ICD-10-CM | POA: Diagnosis not present

## 2020-01-22 DIAGNOSIS — M069 Rheumatoid arthritis, unspecified: Secondary | ICD-10-CM | POA: Diagnosis not present

## 2020-02-03 ENCOUNTER — Other Ambulatory Visit: Payer: Self-pay | Admitting: Orthopedic Surgery

## 2020-02-10 DIAGNOSIS — L814 Other melanin hyperpigmentation: Secondary | ICD-10-CM | POA: Diagnosis not present

## 2020-02-10 DIAGNOSIS — L578 Other skin changes due to chronic exposure to nonionizing radiation: Secondary | ICD-10-CM | POA: Diagnosis not present

## 2020-02-10 DIAGNOSIS — D2271 Melanocytic nevi of right lower limb, including hip: Secondary | ICD-10-CM | POA: Diagnosis not present

## 2020-02-10 DIAGNOSIS — C44612 Basal cell carcinoma of skin of right upper limb, including shoulder: Secondary | ICD-10-CM | POA: Diagnosis not present

## 2020-02-10 DIAGNOSIS — L409 Psoriasis, unspecified: Secondary | ICD-10-CM | POA: Diagnosis not present

## 2020-02-10 DIAGNOSIS — D1801 Hemangioma of skin and subcutaneous tissue: Secondary | ICD-10-CM | POA: Diagnosis not present

## 2020-02-10 DIAGNOSIS — L821 Other seborrheic keratosis: Secondary | ICD-10-CM | POA: Diagnosis not present

## 2020-02-10 DIAGNOSIS — D485 Neoplasm of uncertain behavior of skin: Secondary | ICD-10-CM | POA: Diagnosis not present

## 2020-02-10 DIAGNOSIS — L720 Epidermal cyst: Secondary | ICD-10-CM | POA: Diagnosis not present

## 2020-02-27 NOTE — Patient Instructions (Addendum)
DUE TO COVID-19 ONLY ONE VISITOR IS ALLOWED TO COME WITH YOU AND STAY IN THE WAITING ROOM ONLY DURING PRE  OP AND PROCEDURE.   IF YOU WILL BE ADMITTED INTO THE HOSPITAL YOU ARE ALLOWED ONE SUPPORT PERSON DURING VISITATION HOURS  ONLY (10AM -8PM)    The support person may change daily.  The support person must pass our screening, gel in and out, and wear a mask at all times, including in the patients room.  Patients must also wear a mask when staff or their support person are in the room.   COVID SWAB TESTING MUST BE COMPLETED ON:   Friday, 03-06-20 @ [address] [address] [place], [place] [address]  (Must self quarantine after testing. Follow instructions on handout.)        Your procedure is scheduled on:  Monday, 03-09-20   Report to [facility] Main  Entrance   Report to admitting at 6:45 AM   Call this number if you have problems the morning of surgery [phone]   Do not eat food :After Midnight.   May have liquids until 6:15 AM day of surgery  CLEAR LIQUID DIET  Foods Allowed                                                                     Foods Excluded  Water, Black Coffee and tea, regular and decaf               liquids that you cannot  Plain Jell-O in any flavor  (No red)                                     see through such as: Fruit ices (not with fruit pulp)                                      milk, soups, orange juice              Iced Popsicles (No red)                                      All solid food                                   Apple juices Sports drinks like Gatorade (No red) Lightly seasoned clear broth or consume(fat free) Sugar, honey syrup   Complete one Ensure drink the morning of surgery at 6:15 AM  the day of surgery.    Oral Hygiene is also important to reduce your risk of infection.                                    Remember - BRUSH YOUR TEETH THE MORNING OF SURGERY WITH YOUR REGULAR TOOTHPASTE   Do NOT smoke after  Midnight   Take these medicines  the morning of surgery with A SIP OF WATER:  Amlodipine, Metoprolol, Levothyroxine, Pantoprazole                                You may not have any metal on your body including hair pins, jewelry, and body piercings             Do not wear make-up, lotions, powders, perfumes/cologne, or deodorant             Do not wear nail polish.  Do not shave  48 hours prior to surgery.      Do not bring valuables to the hospital. [place].   Contacts, dentures or bridgework may not be worn into surgery.   Patient discharged the same day cannot drive themselves home.   Bring CPAP mask and tubing day of surgery                 Please read over the following fact sheets you were given: IF YOU HAVE QUESTIONS ABOUT YOUR PRE OP INSTRUCTIONS PLEASE  CALL [facility] - Preparing for Surgery Before surgery, you can play an important role.  Because skin is not sterile, your skin needs to be as free of germs as possible.  You can reduce the number of germs on your skin by washing with CHG (chlorahexidine gluconate) soap before surgery.  CHG is an antiseptic cleaner which kills germs and bonds with the skin to continue killing germs even after washing. Please DO NOT use if you have an allergy to CHG or antibacterial soaps.  If your skin becomes reddened/irritated stop using the CHG and inform your nurse when you arrive at Short Stay. Do not shave (including legs and underarms) for at least 48 hours prior to the first CHG shower.  You may shave your face/neck.  Please follow these instructions carefully:  1.  Shower with CHG Soap the night before surgery and the  morning of surgery.  2.  If you choose to wash your hair, wash your hair first as usual with your normal  shampoo.  3.  After you shampoo, rinse your hair and body thoroughly to remove the shampoo.                             4.  Use CHG as you would any other liquid  soap.  You can apply chg directly to the skin and wash.  Gently with a scrungie or clean washcloth.  5.  Apply the CHG Soap to your body ONLY FROM THE NECK DOWN.   Do   not use on face/ open                           Wound or open sores. Avoid contact with eyes, ears mouth and   genitals (private parts).                       Wash face,  Genitals (private parts) with your normal soap.             6.  Wash thoroughly, paying special attention to the area where your    surgery  will be performed.  7.  Thoroughly rinse your body with warm water from the neck down.  8.  DO NOT shower/wash with your normal soap after using and rinsing off the CHG Soap.                9.  Pat yourself dry with a clean towel.            10.  Wear clean pajamas.            11.  Place clean sheets on your bed the night of your first shower and do not  sleep with pets. Day of Surgery : Do not apply any lotions/deodorants the morning of surgery.  Please wear clean clothes to the hospital/surgery center.  FAILURE TO FOLLOW THESE INSTRUCTIONS MAY RESULT IN THE CANCELLATION OF YOUR SURGERY  PATIENT SIGNATURE_________________________________  NURSE SIGNATURE__________________________________  ________________________________________________________________________   Gina Waller  An incentive spirometer is a tool that can help keep your lungs clear and active. This tool measures how well you are filling your lungs with each breath. Taking long deep breaths may help reverse or decrease the chance of developing breathing (pulmonary) problems (especially infection) following:  A long period of time when you are unable to move or be active. BEFORE THE PROCEDURE   If the spirometer includes an indicator to show your best effort, your nurse or respiratory therapist will set it to a desired goal.  If possible, sit up straight or lean slightly forward. Try not to slouch.  Hold the incentive spirometer in an upright  position. INSTRUCTIONS FOR USE  1. Sit on the edge of your bed if possible, or sit up as far as you can in bed or on a chair. 2. Hold the incentive spirometer in an upright position. 3. Breathe out normally. 4. Place the mouthpiece in your mouth and seal your lips tightly around it. 5. Breathe in slowly and as deeply as possible, raising the piston or the ball toward the top of the column. 6. Hold your breath for 3-5 seconds or for as long as possible. Allow the piston or ball to fall to the bottom of the column. 7. Remove the mouthpiece from your mouth and breathe out normally. 8. Rest for a few seconds and repeat Steps 1 through 7 at least 10 times every 1-2 hours when you are awake. Take your time and take a few normal breaths between deep breaths. 9. The spirometer may include an indicator to show your best effort. Use the indicator as a goal to work toward during each repetition. 10. After each set of 10 deep breaths, practice coughing to be sure your lungs are clear. If you have an incision (the cut made at the time of surgery), support your incision when coughing by placing a pillow or rolled up towels firmly against it. Once you are able to get out of bed, walk around indoors and cough well. You may stop using the incentive spirometer when instructed by your caregiver.  RISKS AND COMPLICATIONS  Take your time so you do not get dizzy or light-headed.  If you are in pain, you may need to take or ask for pain medication before doing incentive spirometry. It is harder to take a deep breath if you are having pain. AFTER USE  Rest and breathe slowly and easily.  It can be helpful to keep track of a log of your progress. Your caregiver can provide you with a simple table to help with this. If you are using the spirometer at home, follow these instructions: [place] IF:   You are having  difficultly using the spirometer.  You have trouble using the spirometer as often as  instructed.  Your pain medication is not giving enough relief while using the spirometer.  You develop fever of 100.5 F (38.1 C) or higher. SEEK IMMEDIATE MEDICAL CARE IF:   You cough up bloody sputum that had not been present before.  You develop fever of 102 F (38.9 C) or greater.  You develop worsening pain at or near the incision site. MAKE SURE YOU:   Understand these instructions.  Will watch your condition.  Will get help right away if you are not doing well or get worse. Document Released: 09/05/2006 Document Revised: 07/18/2011 Document Reviewed: 11/06/2006 [facility] Patient Information 2014 [place], [place].   ________________________________________________________________________

## 2020-02-27 NOTE — Progress Notes (Addendum)
COVID Vaccine Completed:  x2 Date COVID Vaccine completed: 06-24-19 & 07-23-19 COVID vaccine manufacturer: [employer]   PCP - Prince Solian, MD Cardiologist -   Chest x-ray - 2021 at [facility] EKG - 03-02-20  in Epic Stress Test -  ECHO -  Cardiac Cath -  Pacemaker/ICD device last checked:  Sleep Study - 5+ years ago, pos for sleep apnea CPAP - No  Fasting Blood Sugar -  Checks Blood Sugar _____ times a day  Blood Thinner Instructions: Aspirin Instructions:  ASA 81 mg.  Pt states that she is not currently taking (uses prn to help her sleep). Last Dose:  Anesthesia review:   Patient denies shortness of breath, fever, cough and chest pain at PAT appointment   Patient verbalized understanding of instructions that were given to them at the PAT appointment. Patient was also instructed that they will need to review over the PAT instructions again at home before surgery.

## 2020-03-02 ENCOUNTER — Encounter (HOSPITAL_COMMUNITY)
Admission: RE | Admit: 2020-03-02 | Discharge: 2020-03-02 | Disposition: A | Discharge status: Home / Self Care | Payer: PPO | Source: Ambulatory Visit | Attending: Orthopedic Surgery | Admitting: Orthopedic Surgery

## 2020-03-02 ENCOUNTER — Encounter (HOSPITAL_COMMUNITY): Payer: Self-pay

## 2020-03-02 ENCOUNTER — Other Ambulatory Visit: Payer: Self-pay

## 2020-03-02 DIAGNOSIS — Z01818 Encounter for other preprocedural examination: Secondary | ICD-10-CM | POA: Diagnosis not present

## 2020-03-02 DIAGNOSIS — I10 Essential (primary) hypertension: Secondary | ICD-10-CM | POA: Diagnosis not present

## 2020-03-02 HISTORY — DX: Hypothyroidism, unspecified: E03.9

## 2020-03-02 HISTORY — DX: Pneumonia, unspecified organism: J18.9

## 2020-03-02 LAB — CBC WITH DIFFERENTIAL/PLATELET
Abs Immature Granulocytes: 0.01 10*3/uL (ref 0.00–0.07)
Basophils Absolute: 0.1 10*3/uL (ref 0.0–0.1)
Basophils Relative: 1 %
Eosinophils Absolute: 0.1 10*3/uL (ref 0.0–0.5)
Eosinophils Relative: 2 %
HCT: 40.9 % (ref 36.0–46.0)
Hemoglobin: 13.8 g/dL (ref 12.0–15.0)
Immature Granulocytes: 0 %
Lymphocytes Relative: 32 %
Lymphs Abs: 2.3 10*3/uL (ref 0.7–4.0)
MCH: 30.2 pg (ref 26.0–34.0)
MCHC: 33.7 g/dL (ref 30.0–36.0)
MCV: 89.5 fL (ref 80.0–100.0)
Monocytes Absolute: 0.5 10*3/uL (ref 0.1–1.0)
Monocytes Relative: 7 %
Neutro Abs: 4.1 10*3/uL (ref 1.7–7.7)
Neutrophils Relative %: 58 %
Platelets: 325 10*3/uL (ref 150–400)
RBC: 4.57 MIL/uL (ref 3.87–5.11)
RDW: 13.6 % (ref 11.5–15.5)
WBC: 7.1 10*3/uL (ref 4.0–10.5)
nRBC: 0 % (ref 0.0–0.2)

## 2020-03-02 LAB — COMPREHENSIVE METABOLIC PANEL
ALT: 15 U/L (ref 0–44)
AST: 19 U/L (ref 15–41)
Albumin: 4.3 g/dL (ref 3.5–5.0)
Alkaline Phosphatase: 63 U/L (ref 38–126)
Anion gap: 11 (ref 5–15)
BUN: 14 mg/dL (ref 8–23)
CO2: 27 mmol/L (ref 22–32)
Calcium: 10 mg/dL (ref 8.9–10.3)
Chloride: 98 mmol/L (ref 98–111)
Creatinine, Ser: 0.8 mg/dL (ref 0.44–1.00)
GFR, Estimated: >60 mL/min (ref >60)
Glucose, Bld: 92 mg/dL (ref 70–99)
Potassium: 3.6 mmol/L (ref 3.5–5.1)
Sodium: 136 mmol/L (ref 135–145)
Total Bilirubin: 0.7 mg/dL (ref 0.3–1.2)
Total Protein: 7.6 g/dL (ref 6.5–8.1)

## 2020-03-02 LAB — SURGICAL PCR SCREEN
MRSA, PCR: NEGATIVE
Staphylococcus aureus: NEGATIVE

## 2020-03-06 ENCOUNTER — Other Ambulatory Visit (HOSPITAL_COMMUNITY)
Admission: RE | Admit: 2020-03-06 | Discharge: 2020-03-06 | Disposition: A | Discharge status: Home / Self Care | Payer: PPO | Source: Ambulatory Visit | Attending: Orthopedic Surgery | Admitting: Orthopedic Surgery

## 2020-03-06 DIAGNOSIS — Z01812 Encounter for preprocedural laboratory examination: Secondary | ICD-10-CM | POA: Insufficient documentation

## 2020-03-06 DIAGNOSIS — Z20822 Contact with and (suspected) exposure to covid-19: Secondary | ICD-10-CM | POA: Diagnosis not present

## 2020-03-07 LAB — SARS CORONAVIRUS 2 (TAT 6-24 HRS): SARS Coronavirus 2: NEGATIVE

## 2020-03-08 MED ORDER — BUPIVACAINE LIPOSOME 1.3 % IJ SUSP
20.0000 mL | INTRAMUSCULAR | Status: DC
  Filled 2020-03-08: qty 20

## 2020-03-09 ENCOUNTER — Encounter (HOSPITAL_COMMUNITY): Payer: Self-pay | Admitting: Orthopedic Surgery

## 2020-03-09 ENCOUNTER — Ambulatory Visit (HOSPITAL_COMMUNITY): Payer: PPO | Admitting: Anesthesiology

## 2020-03-09 ENCOUNTER — Encounter (HOSPITAL_COMMUNITY)
Admission: RE | Disposition: A | Discharge status: Home / Self Care | Payer: Self-pay | Source: Ambulatory Visit | Attending: Orthopedic Surgery

## 2020-03-09 ENCOUNTER — Other Ambulatory Visit: Payer: Self-pay

## 2020-03-09 ENCOUNTER — Observation Stay (HOSPITAL_COMMUNITY)
Admission: RE | Admit: 2020-03-09 | Discharge: 2020-03-10 | Disposition: A | Discharge status: Home / Self Care | Payer: PPO | Source: Ambulatory Visit | Attending: Orthopedic Surgery | Admitting: Orthopedic Surgery

## 2020-03-09 ENCOUNTER — Telehealth (HOSPITAL_COMMUNITY): Payer: Self-pay | Admitting: *Deleted

## 2020-03-09 DIAGNOSIS — E039 Hypothyroidism, unspecified: Secondary | ICD-10-CM | POA: Diagnosis not present

## 2020-03-09 DIAGNOSIS — Z79899 Other long term (current) drug therapy: Secondary | ICD-10-CM | POA: Diagnosis not present

## 2020-03-09 DIAGNOSIS — M25561 Pain in right knee: Secondary | ICD-10-CM | POA: Diagnosis present

## 2020-03-09 DIAGNOSIS — G8918 Other acute postprocedural pain: Secondary | ICD-10-CM | POA: Diagnosis not present

## 2020-03-09 DIAGNOSIS — E7800 Pure hypercholesterolemia, unspecified: Secondary | ICD-10-CM | POA: Diagnosis not present

## 2020-03-09 DIAGNOSIS — M1711 Unilateral primary osteoarthritis, right knee: Secondary | ICD-10-CM | POA: Diagnosis not present

## 2020-03-09 DIAGNOSIS — Z96651 Presence of right artificial knee joint: Secondary | ICD-10-CM

## 2020-03-09 DIAGNOSIS — I10 Essential (primary) hypertension: Secondary | ICD-10-CM | POA: Insufficient documentation

## 2020-03-09 DIAGNOSIS — Z7982 Long term (current) use of aspirin: Secondary | ICD-10-CM | POA: Insufficient documentation

## 2020-03-09 HISTORY — PX: TOTAL KNEE ARTHROPLASTY: SHX125

## 2020-03-09 HISTORY — DX: Presence of right artificial knee joint: Z96.651

## 2020-03-09 SURGERY — ARTHROPLASTY, KNEE, TOTAL
Anesthesia: Spinal | Site: Knee | Laterality: Right

## 2020-03-09 MED ORDER — LIDOCAINE 2% (20 MG/ML) 5 ML SYRINGE
INTRAMUSCULAR | Status: DC | PRN
  Administered 2020-03-09: 40 mg via INTRAVENOUS

## 2020-03-09 MED ORDER — SODIUM CHLORIDE 0.9 % IR SOLN
Status: DC | PRN
  Administered 2020-03-09: 1000 mL

## 2020-03-09 MED ORDER — HYDROCHLOROTHIAZIDE 12.5 MG PO CAPS
12.5000 mg | ORAL_CAPSULE | Freq: Every day | ORAL | Status: DC
  Administered 2020-03-09 – 2020-03-10 (×2): 12.5 mg via ORAL
  Filled 2020-03-09 (×2): qty 1

## 2020-03-09 MED ORDER — LACTATED RINGERS IV SOLN
INTRAVENOUS | Status: DC
  Administered 2020-03-09: 1000 mL via INTRAVENOUS

## 2020-03-09 MED ORDER — OXYCODONE HCL 5 MG/5ML PO SOLN: 5.0000 mg | Freq: Once | ORAL | Status: DC | PRN

## 2020-03-09 MED ORDER — FERROUS SULFATE 325 (65 FE) MG PO TABS
325.0000 mg | ORAL_TABLET | Freq: Three times a day (TID) | ORAL | Status: DC
  Administered 2020-03-09 – 2020-03-10 (×3): 325 mg via ORAL
  Filled 2020-03-09 (×3): qty 1

## 2020-03-09 MED ORDER — DEXAMETHASONE SODIUM PHOSPHATE 10 MG/ML IJ SOLN
10.0000 mg | Freq: Once | INTRAMUSCULAR | Status: AC
  Administered 2020-03-10: 10 mg via INTRAVENOUS
  Filled 2020-03-09: qty 1

## 2020-03-09 MED ORDER — TRANEXAMIC ACID-NACL 1000-0.7 MG/100ML-% IV SOLN
1000.0000 mg | INTRAVENOUS | Status: AC
  Administered 2020-03-09: 1000 mg via INTRAVENOUS
  Filled 2020-03-09: qty 100

## 2020-03-09 MED ORDER — ONDANSETRON HCL 4 MG/2ML IJ SOLN: 4.0000 mg | Freq: Four times a day (QID) | INTRAMUSCULAR | Status: DC | PRN

## 2020-03-09 MED ORDER — DIPHENHYDRAMINE HCL 12.5 MG/5ML PO ELIX: 12.5000 mg | ORAL_SOLUTION | ORAL | Status: DC | PRN

## 2020-03-09 MED ORDER — ROPIVACAINE HCL 7.5 MG/ML IJ SOLN
INTRAMUSCULAR | Status: DC | PRN
  Administered 2020-03-09: 20 mL via PERINEURAL

## 2020-03-09 MED ORDER — MEPIVACAINE HCL (PF) 2 % IJ SOLN
INTRAMUSCULAR | Status: DC | PRN
  Administered 2020-03-09: 50 mg via INTRATHECAL

## 2020-03-09 MED ORDER — FENTANYL CITRATE (PF) 100 MCG/2ML IJ SOLN
50.0000 ug | INTRAMUSCULAR | Status: DC
  Administered 2020-03-09: 50 ug via INTRAVENOUS
  Filled 2020-03-09: qty 2

## 2020-03-09 MED ORDER — PROMETHAZINE HCL 25 MG/ML IJ SOLN: 6.2500 mg | INTRAMUSCULAR | Status: DC | PRN

## 2020-03-09 MED ORDER — TRANEXAMIC ACID-NACL 1000-0.7 MG/100ML-% IV SOLN
INTRAVENOUS | Status: AC
  Filled 2020-03-09: qty 100

## 2020-03-09 MED ORDER — 0.9 % SODIUM CHLORIDE (POUR BTL) OPTIME
TOPICAL | Status: DC | PRN
  Administered 2020-03-09: 1000 mL

## 2020-03-09 MED ORDER — CROMOLYN SODIUM 4 % OP SOLN
1.0000 [drp] | Freq: Every day | OPHTHALMIC | Status: DC | PRN
  Filled 2020-03-09: qty 10

## 2020-03-09 MED ORDER — PROPOFOL 500 MG/50ML IV EMUL
INTRAVENOUS | Status: DC | PRN
  Administered 2020-03-09: 75 ug/kg/min via INTRAVENOUS

## 2020-03-09 MED ORDER — PROPOFOL 10 MG/ML IV BOLUS
INTRAVENOUS | Status: AC
  Filled 2020-03-09: qty 20

## 2020-03-09 MED ORDER — ASPIRIN EC 325 MG PO TBEC
325.0000 mg | DELAYED_RELEASE_TABLET | Freq: Two times a day (BID) | ORAL | Status: DC
  Administered 2020-03-10: 325 mg via ORAL
  Filled 2020-03-09: qty 1

## 2020-03-09 MED ORDER — ZOLPIDEM TARTRATE 5 MG PO TABS
5.0000 mg | ORAL_TABLET | Freq: Every evening | ORAL | Status: DC | PRN
  Administered 2020-03-09: 5 mg via ORAL
  Filled 2020-03-09: qty 1

## 2020-03-09 MED ORDER — ACETAMINOPHEN 500 MG PO TABS
1000.0000 mg | ORAL_TABLET | Freq: Once | ORAL | Status: AC
  Administered 2020-03-09: 1000 mg via ORAL
  Filled 2020-03-09: qty 2

## 2020-03-09 MED ORDER — ASPIRIN EC 325 MG PO TBEC: 325.0000 mg | DELAYED_RELEASE_TABLET | Freq: Two times a day (BID) | ORAL | 0 refills | Status: AC

## 2020-03-09 MED ORDER — SODIUM CHLORIDE 0.9 % IV SOLN: INTRAVENOUS | Status: DC

## 2020-03-09 MED ORDER — IRBESARTAN 150 MG PO TABS
300.0000 mg | ORAL_TABLET | Freq: Every day | ORAL | Status: DC
  Administered 2020-03-09 – 2020-03-10 (×2): 300 mg via ORAL
  Filled 2020-03-09 (×2): qty 2

## 2020-03-09 MED ORDER — CEFAZOLIN SODIUM-DEXTROSE 2-4 GM/100ML-% IV SOLN
2.0000 g | Freq: Four times a day (QID) | INTRAVENOUS | Status: AC
  Administered 2020-03-09 (×2): 2 g via INTRAVENOUS
  Filled 2020-03-09: qty 100

## 2020-03-09 MED ORDER — LACTATED RINGERS IV BOLUS
250.0000 mL | Freq: Once | INTRAVENOUS | Status: AC
  Administered 2020-03-09: 250 mL via INTRAVENOUS

## 2020-03-09 MED ORDER — METOCLOPRAMIDE HCL 5 MG PO TABS: 5.0000 mg | ORAL_TABLET | Freq: Three times a day (TID) | ORAL | Status: DC | PRN

## 2020-03-09 MED ORDER — DEXAMETHASONE SODIUM PHOSPHATE 10 MG/ML IJ SOLN: 8.0000 mg | Freq: Once | INTRAMUSCULAR | Status: DC

## 2020-03-09 MED ORDER — MIDAZOLAM HCL 2 MG/2ML IJ SOLN: 0.5000 mg | Freq: Once | INTRAMUSCULAR | Status: DC | PRN

## 2020-03-09 MED ORDER — ACETAMINOPHEN 500 MG PO TABS: 1000.0000 mg | ORAL_TABLET | Freq: Once | ORAL | Status: DC

## 2020-03-09 MED ORDER — ALUM & MAG HYDROXIDE-SIMETH 200-200-20 MG/5ML PO SUSP: 30.0000 mL | ORAL | Status: DC | PRN

## 2020-03-09 MED ORDER — SENNOSIDES-DOCUSATE SODIUM 8.6-50 MG PO TABS
1.0000 | ORAL_TABLET | Freq: Every evening | ORAL | Status: DC | PRN
  Administered 2020-03-09: 1 via ORAL
  Filled 2020-03-09: qty 1

## 2020-03-09 MED ORDER — HYDROMORPHONE HCL 1 MG/ML IJ SOLN: 0.5000 mg | INTRAMUSCULAR | Status: DC | PRN

## 2020-03-09 MED ORDER — ONDANSETRON HCL 4 MG/2ML IJ SOLN
INTRAMUSCULAR | Status: DC | PRN
  Administered 2020-03-09: 4 mg via INTRAVENOUS

## 2020-03-09 MED ORDER — ROSUVASTATIN CALCIUM 20 MG PO TABS
20.0000 mg | ORAL_TABLET | Freq: Every day | ORAL | Status: DC
  Administered 2020-03-09 – 2020-03-10 (×2): 20 mg via ORAL
  Filled 2020-03-09 (×2): qty 1

## 2020-03-09 MED ORDER — OXYCODONE HCL 5 MG PO TABS: 5.0000 mg | ORAL_TABLET | Freq: Once | ORAL | Status: DC | PRN

## 2020-03-09 MED ORDER — DOCUSATE SODIUM 100 MG PO CAPS
100.0000 mg | ORAL_CAPSULE | Freq: Two times a day (BID) | ORAL | Status: DC
  Administered 2020-03-09 – 2020-03-10 (×2): 100 mg via ORAL
  Filled 2020-03-09 (×2): qty 1

## 2020-03-09 MED ORDER — POVIDONE-IODINE 10 % EX SWAB
2.0000 "application " | Freq: Once | CUTANEOUS | Status: AC
  Administered 2020-03-09: 2 via TOPICAL

## 2020-03-09 MED ORDER — MEPERIDINE HCL 50 MG/ML IJ SOLN: 6.2500 mg | INTRAMUSCULAR | Status: DC | PRN

## 2020-03-09 MED ORDER — AMLODIPINE BESYLATE 5 MG PO TABS
5.0000 mg | ORAL_TABLET | Freq: Every day | ORAL | Status: DC
  Administered 2020-03-09 – 2020-03-10 (×2): 5 mg via ORAL
  Filled 2020-03-09 (×2): qty 1

## 2020-03-09 MED ORDER — PANTOPRAZOLE SODIUM 40 MG PO TBEC: 40.0000 mg | DELAYED_RELEASE_TABLET | Freq: Every day | ORAL | Status: DC

## 2020-03-09 MED ORDER — LEVOTHYROXINE SODIUM 50 MCG PO TABS
50.0000 ug | ORAL_TABLET | Freq: Every day | ORAL | Status: DC
  Administered 2020-03-10: 50 ug via ORAL
  Filled 2020-03-09: qty 1

## 2020-03-09 MED ORDER — ORAL CARE MOUTH RINSE: 15.0000 mL | Freq: Once | OROMUCOSAL | Status: AC

## 2020-03-09 MED ORDER — BUPIVACAINE LIPOSOME 1.3 % IJ SUSP
INTRAMUSCULAR | Status: DC | PRN
  Administered 2020-03-09: 20 mL

## 2020-03-09 MED ORDER — OXYCODONE HCL 5 MG PO TABS: 5.0000 mg | ORAL_TABLET | ORAL | Status: DC | PRN

## 2020-03-09 MED ORDER — BUPIVACAINE-EPINEPHRINE 0.25% -1:200000 IJ SOLN
INTRAMUSCULAR | Status: DC | PRN
  Administered 2020-03-09: 30 mL

## 2020-03-09 MED ORDER — PANTOPRAZOLE SODIUM 40 MG PO TBEC
40.0000 mg | DELAYED_RELEASE_TABLET | Freq: Every day | ORAL | Status: DC
  Administered 2020-03-09 – 2020-03-10 (×2): 40 mg via ORAL
  Filled 2020-03-09 (×2): qty 1

## 2020-03-09 MED ORDER — DEXAMETHASONE SODIUM PHOSPHATE 10 MG/ML IJ SOLN
INTRAMUSCULAR | Status: DC | PRN
  Administered 2020-03-09: 8 mg via INTRAVENOUS

## 2020-03-09 MED ORDER — CELECOXIB 200 MG PO CAPS
400.0000 mg | ORAL_CAPSULE | Freq: Once | ORAL | Status: AC
  Administered 2020-03-09: 400 mg via ORAL
  Filled 2020-03-09: qty 2

## 2020-03-09 MED ORDER — MENTHOL 3 MG MT LOZG: 1.0000 | LOZENGE | OROMUCOSAL | Status: DC | PRN

## 2020-03-09 MED ORDER — PROPOFOL 10 MG/ML IV BOLUS
INTRAVENOUS | Status: DC | PRN
  Administered 2020-03-09 (×4): 20 mg via INTRAVENOUS

## 2020-03-09 MED ORDER — SODIUM CHLORIDE 0.9% FLUSH
INTRAVENOUS | Status: DC | PRN
  Administered 2020-03-09: 20 mL

## 2020-03-09 MED ORDER — WATER FOR IRRIGATION, STERILE IR SOLN
Status: DC | PRN
  Administered 2020-03-09: 2000 mL

## 2020-03-09 MED ORDER — FENTANYL CITRATE (PF) 100 MCG/2ML IJ SOLN
INTRAMUSCULAR | Status: AC
  Filled 2020-03-09: qty 2

## 2020-03-09 MED ORDER — TELMISARTAN-HCTZ 80-12.5 MG PO TABS: 1.0000 | ORAL_TABLET | Freq: Every day | ORAL | Status: DC

## 2020-03-09 MED ORDER — CEFAZOLIN SODIUM-DEXTROSE 2-4 GM/100ML-% IV SOLN
2.0000 g | INTRAVENOUS | Status: AC
  Administered 2020-03-09: 2 g via INTRAVENOUS
  Filled 2020-03-09: qty 100

## 2020-03-09 MED ORDER — TIZANIDINE HCL 2 MG PO TABS: 2.0000 mg | ORAL_TABLET | Freq: Four times a day (QID) | ORAL | 0 refills | Status: DC | PRN

## 2020-03-09 MED ORDER — PHENOL 1.4 % MT LIQD: 1.0000 | OROMUCOSAL | Status: DC | PRN

## 2020-03-09 MED ORDER — ACETAMINOPHEN 500 MG PO TABS
1000.0000 mg | ORAL_TABLET | Freq: Four times a day (QID) | ORAL | Status: AC
  Administered 2020-03-09 – 2020-03-10 (×4): 1000 mg via ORAL
  Filled 2020-03-09 (×4): qty 2

## 2020-03-09 MED ORDER — ONDANSETRON HCL 4 MG PO TABS: 4.0000 mg | ORAL_TABLET | Freq: Four times a day (QID) | ORAL | Status: DC | PRN

## 2020-03-09 MED ORDER — BISACODYL 5 MG PO TBEC
5.0000 mg | DELAYED_RELEASE_TABLET | Freq: Every day | ORAL | Status: DC | PRN
  Administered 2020-03-10: 5 mg via ORAL
  Filled 2020-03-09: qty 1

## 2020-03-09 MED ORDER — HYDROMORPHONE HCL 1 MG/ML IJ SOLN: 0.2500 mg | INTRAMUSCULAR | Status: DC | PRN

## 2020-03-09 MED ORDER — CEFAZOLIN SODIUM-DEXTROSE 2-4 GM/100ML-% IV SOLN
INTRAVENOUS | Status: AC
  Filled 2020-03-09: qty 100

## 2020-03-09 MED ORDER — FLEET ENEMA 7-19 GM/118ML RE ENEM: 1.0000 | ENEMA | Freq: Once | RECTAL | Status: DC | PRN

## 2020-03-09 MED ORDER — METOPROLOL SUCCINATE ER 50 MG PO TB24
50.0000 mg | ORAL_TABLET | Freq: Every day | ORAL | Status: DC
  Administered 2020-03-09: 50 mg via ORAL
  Filled 2020-03-09: qty 1

## 2020-03-09 MED ORDER — PHENYLEPHRINE HCL-NACL 10-0.9 MG/250ML-% IV SOLN
INTRAVENOUS | Status: DC | PRN
  Administered 2020-03-09: 25 ug/min via INTRAVENOUS

## 2020-03-09 MED ORDER — LACTATED RINGERS IV BOLUS
500.0000 mL | Freq: Once | INTRAVENOUS | Status: AC
  Administered 2020-03-09: 500 mL via INTRAVENOUS

## 2020-03-09 MED ORDER — CHLORHEXIDINE GLUCONATE 0.12 % MT SOLN
15.0000 mL | Freq: Once | OROMUCOSAL | Status: AC
  Administered 2020-03-09: 15 mL via OROMUCOSAL

## 2020-03-09 MED ORDER — SODIUM CHLORIDE (PF) 0.9 % IJ SOLN
INTRAMUSCULAR | Status: AC
  Filled 2020-03-09: qty 50

## 2020-03-09 MED ORDER — METOCLOPRAMIDE HCL 5 MG/ML IJ SOLN: 5.0000 mg | Freq: Three times a day (TID) | INTRAMUSCULAR | Status: DC | PRN

## 2020-03-09 MED ORDER — TRANEXAMIC ACID-NACL 1000-0.7 MG/100ML-% IV SOLN
1000.0000 mg | Freq: Once | INTRAVENOUS | Status: AC
  Administered 2020-03-09: 1000 mg via INTRAVENOUS

## 2020-03-09 MED ORDER — OXYCODONE HCL 5 MG PO TABS
5.0000 mg | ORAL_TABLET | Freq: Four times a day (QID) | ORAL | 0 refills | Status: DC | PRN
Start: 2020-03-09 — End: 2023-07-07

## 2020-03-09 MED ORDER — GABAPENTIN 300 MG PO CAPS
300.0000 mg | ORAL_CAPSULE | Freq: Once | ORAL | Status: AC
  Administered 2020-03-09: 300 mg via ORAL
  Filled 2020-03-09: qty 1

## 2020-03-09 MED ORDER — BUPIVACAINE-EPINEPHRINE (PF) 0.25% -1:200000 IJ SOLN
INTRAMUSCULAR | Status: AC
  Filled 2020-03-09: qty 30

## 2020-03-09 SURGICAL SUPPLY — 58 items
ARTISURF 10M PLY R 6-9CD KNEE (Knees) ×2 IMPLANT
BAG SPEC THK2 15X12 ZIP CLS (MISCELLANEOUS) ×1
BAG ZIPLOCK 12X15 (MISCELLANEOUS) ×3 IMPLANT
BLADE SAGITTAL 13X1.27X60 (BLADE) ×2 IMPLANT
BLADE SAGITTAL 13X1.27X60MM (BLADE) ×1
BLADE SAW SGTL 83.5X18.5 (BLADE) ×3 IMPLANT
BLADE SURG 15 STRL LF DISP TIS (BLADE) ×1 IMPLANT
BLADE SURG 15 STRL SS (BLADE) ×3
BLADE SURG SZ10 CARB STEEL (BLADE) ×6 IMPLANT
BNDG ELASTIC 6X5.8 VLCR STR LF (GAUZE/BANDAGES/DRESSINGS) ×3 IMPLANT
BOWL SMART MIX CTS (DISPOSABLE) ×3 IMPLANT
BSPLAT TIB 5D D CMNT STM RT (Knees) ×1 IMPLANT
CEMENT BONE SIMPLEX SPEEDSET (Cement) ×6 IMPLANT
CLOSURE WOUND 1/2 X4 (GAUZE/BANDAGES/DRESSINGS) ×2
COVER SURGICAL LIGHT HANDLE (MISCELLANEOUS) ×3 IMPLANT
COVER WAND RF STERILE (DRAPES) IMPLANT
CUFF TOURN SGL QUICK 34 (TOURNIQUET CUFF) ×3
CUFF TRNQT CYL 34X4.125X (TOURNIQUET CUFF) ×1 IMPLANT
DECANTER SPIKE VIAL GLASS SM (MISCELLANEOUS) ×6 IMPLANT
DRAPE INCISE IOBAN 66X45 STRL (DRAPES) ×6 IMPLANT
DRAPE U-SHAPE 47X51 STRL (DRAPES) ×3 IMPLANT
DRSG AQUACEL AG ADV 3.5X10 (GAUZE/BANDAGES/DRESSINGS) ×3 IMPLANT
DURAPREP 26ML APPLICATOR (WOUND CARE) ×6 IMPLANT
ELECT REM PT RETURN 15FT ADLT (MISCELLANEOUS) ×3 IMPLANT
FEMORAL KNEE COMPONENT SZ6 RT (Joint) ×2 IMPLANT
GLOVE BIOGEL M STRL SZ7.5 (GLOVE) ×1 IMPLANT
GLOVE BIOGEL PI IND STRL 7.5 (GLOVE) ×1 IMPLANT
GLOVE BIOGEL PI IND STRL 8.5 (GLOVE) ×2 IMPLANT
GLOVE BIOGEL PI INDICATOR 7.5 (GLOVE)
GLOVE BIOGEL PI INDICATOR 8.5 (GLOVE) ×4
GLOVE SURG ORTHO 8.0 STRL STRW (GLOVE) ×9 IMPLANT
GOWN STRL REUS W/ TWL XL LVL3 (GOWN DISPOSABLE) ×2 IMPLANT
GOWN STRL REUS W/TWL XL LVL3 (GOWN DISPOSABLE) ×6
HANDPIECE INTERPULSE COAX TIP (DISPOSABLE) ×3
HOLDER FOLEY CATH W/STRAP (MISCELLANEOUS) ×3 IMPLANT
HOOD PEEL AWAY FLYTE STAYCOOL (MISCELLANEOUS) ×9 IMPLANT
KIT TURNOVER KIT A (KITS) IMPLANT
MANIFOLD NEPTUNE II (INSTRUMENTS) ×3 IMPLANT
NEEDLE HYPO 22GX1.5 SAFETY (NEEDLE) ×3 IMPLANT
NS IRRIG 1000ML POUR BTL (IV SOLUTION) ×3 IMPLANT
PACK TOTAL KNEE CUSTOM (KITS) ×3 IMPLANT
PENCIL SMOKE EVACUATOR (MISCELLANEOUS) ×3 IMPLANT
PROTECTOR NERVE ULNAR (MISCELLANEOUS) ×3 IMPLANT
SET HNDPC FAN SPRY TIP SCT (DISPOSABLE) ×1 IMPLANT
STEM POLY PAT PLY 32M KNEE (Knees) ×2 IMPLANT
STEM TIBIA 5 DEG SZ D R KNEE (Knees) IMPLANT
STRIP CLOSURE SKIN 1/2X4 (GAUZE/BANDAGES/DRESSINGS) ×3 IMPLANT
SUT BONE WAX W31G (SUTURE) ×3 IMPLANT
SUT MNCRL AB 3-0 PS2 18 (SUTURE) ×3 IMPLANT
SUT STRATAFIX 0 PDS 27 VIOLET (SUTURE) ×3
SUT STRATAFIX PDS+ 0 24IN (SUTURE) ×3 IMPLANT
SUT VIC AB 1 CT1 36 (SUTURE) ×3 IMPLANT
SUTURE STRATFX 0 PDS 27 VIOLET (SUTURE) ×1 IMPLANT
SYR 30ML LL (SYRINGE) ×6 IMPLANT
TIBIA STEM 5 DEG SZ D R KNEE (Knees) ×3 IMPLANT
TRAY FOLEY MTR SLVR 16FR STAT (SET/KITS/TRAYS/PACK) ×3 IMPLANT
WATER STERILE IRR 1000ML POUR (IV SOLUTION) ×6 IMPLANT
WRAP KNEE MAXI GEL POST OP (GAUZE/BANDAGES/DRESSINGS) ×3 IMPLANT

## 2020-03-09 NOTE — Op Note (Signed)
TOTAL KNEE REPLACEMENT OPERATIVE NOTE:  03/09/2020  11:18 AM  PATIENT:  Gina Waller  86 y.o. female  PRE-OPERATIVE DIAGNOSIS:  Osteoarthritis of right knee M17.11  POST-OPERATIVE DIAGNOSIS:  Osteoarthritis of right knee M17.11  PROCEDURE:  Procedure(s): TOTAL KNEE ARTHROPLASTY  SURGEON:  Surgeon(s): Vickey Huger, MD  PHYSICIAN ASSISTANT: Carlyon Shadow, PA-C  ANESTHESIA:   spinal  SPECIMEN: None  COUNTS:  Correct  TOURNIQUET:   Total Tourniquet Time Documented: Thigh (Right) - 38 minutes Total: Thigh (Right) - 38 minutes   DICTATION:  Indication for procedure:    The patient is a 86 y.o. female who has failed conservative treatment for Osteoarthritis of right knee M17.11.  Informed consent was obtained prior to anesthesia. The risks versus benefits of the operation were explain and in a way the patient can, and did, understand.    Description of procedure:     The patient was taken to the operating room and placed under anesthesia.  The patient was positioned in the usual fashion taking care that all body parts were adequately padded and/or protected.  A tourniquet was applied and the leg prepped and draped in the usual sterile fashion.  The extremity was exsanguinated with the esmarch and tourniquet inflated to 250 mmHg.  Pre-operative range of motion was normal.    A midline incision approximately 6-7 inches long was made with a #10 blade.  A new blade was used to make a parapatellar arthrotomy going 2-3 cm into the quadriceps tendon, over the patella, and alongside the medial aspect of the patellar tendon.  A synovectomy was then performed with the #10 blade and forceps. I then elevated the deep MCL off the medial tibial metaphysis subperiosteally around to the semimembranosus attachment.    I everted the patella and used calipers to measure patellar thickness.  I used the reamer to ream down to appropriate thickness to recreate the native thickness.  I then  removed excess bone with the rongeur and sagittal saw.  I used the appropriately sized template and drilled the three lug holes.  I then put the trial in place and measured the thickness with the calipers to ensure recreation of the native thickness.  The trial was then removed and the patella subluxed and the knee brought into flexion.  A homan retractor was place to retract and protect the patella and lateral structures.  A Z-retractor was place medially to protect the medial structures.  The extra-medullary alignment system was used to make cut the tibial articular surface perpendicular to the anamotic axis of the tibia and in 3 degrees of posterior slope.  The cut surface and alignment jig was removed.  I then used the intramedullary alignment guide to make a valgus cut on the distal femur.  I then marked out the epicondylar axis on the distal femur.  I then used the anterior referencing sizer and measured the femur to be a size 6.  The 4-In-1 cutting block was screwed into place in external rotation matching the posterior condylar angle, making our cuts perpendicular to the epicondylar axis.  Anterior, posterior and chamfer cuts were made with the sagittal saw.  The cutting block and cut pieces were removed.  A lamina spreader was placed in 90 degrees of flexion.  The ACL, PCL, menisci, and posterior condylar osteophytes were removed.  A 10 mm spacer blocked was found to offer good flexion and extension gap balance after minimal in degree releasing.   The scoop retractor was then placed and  the femoral finishing block was pinned in place.  The small sagittal saw was used as well as the lug drill to finish the femur.  The block and cut surfaces were removed and the medullary canal hole filled with autograft bone from the cut pieces.  The tibia was delivered forward in deep flexion and external rotation.  A size D tray was selected and pinned into place centered on the medial 1/3 of the tibial tubercle.   The reamer and keel was used to prepare the tibia through the tray.    I then trialed with the size 6 femur, size D tibia, a 10 mm insert and the 32 patella.  I had excellent flexion/extension gap balance, excellent patella tracking.  Flexion was full and beyond 120 degrees; extension was zero.  These components were chosen and the staff opened them to me on the back table while the knee was lavaged copiously and the cement mixed.  The soft tissue was infiltrated with 60cc of exparel 1.3% through a 21 gauge needle.  I cemented in the components and removed all excess cement.  The polyethylene tibial component was snapped into place and the knee placed in extension while cement was hardening.  The capsule was infilltrated with a 60cc exparel/marcaine/saline mixture.   Once the cement was hard, the tourniquet was let down.  Hemostasis was obtained.  The arthrotomy was closed using a #1 stratofix running suture.  The deep soft tissues were closed with #0 vicryls and the subcuticular layer closed with #2-0 vicryl.  The skin was reapproximated and closed with 3.0 Monocryl.  The wound was covered with steristrips, aquacel dressing, and a TED stocking.   The patient was then awakened, extubated, and taken to the recovery room in stable condition.  BLOOD LOSS:  329VB COMPLICATIONS:  None.  PLAN OF CARE: Discharge to home after PACU  PATIENT DISPOSITION:  PACU - hemodynamically stable.    Please fax a copy of this op note to my office at [phone] (please only include page 1 and 2 of the Case Information op note)

## 2020-03-09 NOTE — Evaluation (Signed)
Physical Therapy Evaluation Patient Details Name: Gina Waller MRN: [id number] DOB: April 06, 1928 Today's Date: 03/09/2020   History of Present Illness  Patient is 86 y.o. female s/p Rt TKA on 03/09/20 with PMH significant for OA, HTN, hypothyroidism, GERD.   Clinical Impression  Gina Waller is a 86 y.o. female POD 0 s/p Rt TKA. Patient reports independence with RW for mobility at baseline. Patient is now limited by functional impairments (see PT problem list below) and requires min assist for transfers and gait with RW. Patient was able to ambulate ~90 feet with RW and min assist and initiated stair training however was limited by pain and sensation of instability. Patient instructed in exercise to facilitate circulation. Patient will benefit from continued skilled PT interventions to address impairments and progress towards PLOF. Acute PT will follow to progress mobility and stair training in preparation for safe discharge home.       03/09/20 1400  PT Visit Information  Last PT Received On 03/09/20  Assistance Needed +1  History of Present Illness Patient is 86 y.o. female s/p Rt TKA on 03/09/20 with PMH significant for OA, HTN, hypothyroidism, GERD.  Precautions  Precautions Fall  Restrictions  Weight Bearing Restrictions No  Home Living  Family/patient expects to be discharged to: Private residence  Living Arrangements Alone  Available Help at Discharge Family;Available 24 hours/day;Personal care attendant  Type of [place] to enter  Entrance Stairs-Number of Steps 3 or 6  Entrance Stairs-Rails Right;Can reach both;Left  Home Layout One level  Bathroom Shower/Tub  (walk in tub)  Biochemist, clinical Handicapped height  Bathroom Accessibility Yes  Home Equipment Walker - 2 wheels;Cane - single point;BSC;Grab bars - toilet;Grab bars - tub/shower;Shower seat - built in  Additional Comments Pt will have assist from her niece and from [facility] [place] aids 24/7 Wednesday  the 3rd through the 11th.  Prior Function  Level of Independence Independent with assistive device(s)  Comments pt using RW for mobility. Independent with ADLs.  Communication  Communication No difficulties  Pain Assessment  Pain Assessment 0-10  Pain Score 4  Pain Location Rt knee and Rt lower leg.  Pain Descriptors / Indicators Aching;Burning;Discomfort;Sore (c/o tighness and soreness in back to lower leg)  Pain Intervention(s) Limited activity within patient's tolerance;Monitored during session;Repositioned  Cognition  Arousal/Alertness Awake/alert  Behavior During Therapy WFL for tasks assessed/performed  Overall Cognitive Status Within Functional Limits for tasks assessed  Upper Extremity Assessment  Upper Extremity Assessment Generalized weakness  Lower Extremity Assessment  Lower Extremity Assessment Generalized weakness;RLE deficits/detail  RLE Deficits / Details good quad activation, no extensor lag with SLR  RLE Sensation WNL  RLE Coordination WNL  Cervical / Trunk Assessment  Cervical / Trunk Assessment Kyphotic  Bed Mobility  Overal bed mobility Needs Assistance  Bed Mobility Supine to Sit;Sit to Supine  Supine to sit Min guard;Supervision  Sit to supine Min assist  General bed mobility comments Pt using bed rail and taking extra time to raise trunk and sit at EOB. Assist provided to bring Rt LE back onto bed at EOS.  Transfers  Overall transfer level Needs assistance  Equipment used Rolling walker (2 wheeled)  Transfers Sit to/from [facility]  Sit to Stand Min guard  Stand pivot transfers Min guard;Min assist  General transfer comment cues for safe hand placement on RW, clsoe min guard for safety with rising and cues for safe reach back to sit. Min assist/guard for bed<>BSC transfer with RW.  Ambulation/Gait  Ambulation/Gait assistance Min assist  Gait Distance (Feet) 90 Feet  Assistive device Rolling walker (2 wheeled)  Gait Pattern/deviations  Step-to pattern;Decreased step length - right;Decreased step length - left;Decreased stride length;Decreased weight shift to right;Trunk flexed  General Gait Details VC's for safe step pattern and proximity to RW throughout. Pt with poor weigth distribution on RW and walker tipping towards Rt requiring assist to steady. No buckling noted at Rt knee.  Gait velocity decr  Stairs Yes  Stairs assistance Min assist  Stair Management One rail Right;Step to pattern;Sideways  Number of Stairs 1  General stair comments pt performed singel side step up stair with bil UE support on hand rail. Pt c/o discomfort and sensation of poor control/stability on Rt LE. Discontinued stair training and returned bed to rest.  Balance  Overall balance assessment Needs assistance  Sitting-balance support Feet supported  Sitting balance-Leahy Scale Fair  Sitting balance - Comments pt completed pericare in sitting, minimal shifting, guarding for safety.  Standing balance support During functional activity;Bilateral upper extremity supported  Standing balance-Leahy Scale Poor  Exercises  Exercises Total Joint  Total Joint Exercises  Ankle Circles/Pumps AROM;Both;20 reps;Supine  PT - End of Session  Equipment Utilized During Treatment Gait belt  Activity Tolerance Patient tolerated treatment well  Patient left in bed;with call bell/phone within reach;with family/visitor present  Nurse Communication Mobility status  PT Assessment  PT Recommendation/Assessment Patient needs continued PT services  PT Visit Diagnosis Muscle weakness (generalized) (M62.81);Difficulty in walking, not elsewhere classified (R26.2)  PT Problem List Decreased strength;Decreased range of motion;Decreased activity tolerance;Decreased balance;Decreased mobility;Decreased knowledge of use of DME;Decreased knowledge of precautions;Decreased safety awareness  PT Plan  PT Frequency (ACUTE ONLY) 7X/week  PT Treatment/Interventions (ACUTE ONLY) DME  instruction;Gait training;Stair training;Functional mobility training;Therapeutic activities;Therapeutic exercise;Balance training;Patient/family education  AM-PAC PT "6 Clicks" Mobility Outcome Measure (Version 2)  Help needed turning from your back to your side while in a flat bed without using bedrails? 3  Help needed moving from lying on your back to sitting on the side of a flat bed without using bedrails? 3  Help needed moving to and from a bed to a chair (including a wheelchair)? 3  Help needed standing up from a chair using your arms (e.g., wheelchair or bedside chair)? 3  Help needed to walk in hospital room? 3  Help needed climbing 3-5 steps with a railing?  3  6 Click Score 18  Consider Recommendation of Discharge To: Home with [facility]  PT Recommendation  Follow Up Recommendations Follow surgeon's recommendation for DC plan and follow-up therapies;Home health PT  PT equipment None recommended by PT  Individuals Consulted  Consulted and Agree with Results and Recommendations Patient;Family member/caregiver  Family Member Consulted pt's neice  Acute Rehab PT Goals  Patient Stated Goal recover back to independence  PT Goal Formulation With patient  Time For Goal Achievement 03/16/20  Potential to Achieve Goals Good  PT Time Calculation  PT Start Time (ACUTE ONLY) 1452  PT Stop Time (ACUTE ONLY) 1535  PT Time Calculation (min) (ACUTE ONLY) 43 min  PT General Charges  $$ ACUTE PT VISIT 1 Visit  PT Evaluation  $PT Eval Low Complexity 1 Low  PT Treatments  $Gait Training 8-22 mins  $Therapeutic Activity 8-22 mins  Written Expression  Dominant Hand Left    Verner Mould, DPT Acute Rehabilitation Services  Office [phone] Pager [phone]  03/09/2020 6:59 PM

## 2020-03-09 NOTE — Anesthesia Preprocedure Evaluation (Addendum)
Anesthesia Evaluation  Patient identified by MRN, date of birth, ID band Patient awake    Reviewed: Allergy & Precautions, NPO status , Patient's Chart, lab work & pertinent test results, reviewed documented beta blocker date and time   History of Anesthesia Complications Negative for: history of anesthetic complications  Airway Mallampati: I  TM Distance: >3 FB Neck ROM: Full    Dental  (+) Dental Advisory Given, Caps, Teeth Intact   Pulmonary sleep apnea (no longer requires CPAP) ,  03/06/2020 SARS coronavirus NEG   breath sounds clear to auscultation       Cardiovascular hypertension, Pt. on medications and Pt. on home beta blockers (-) angina Rhythm:Regular Rate:Normal  '08 ECHO: EF 65%, no significant valvular abnormalities   Neuro/Psych negative neurological ROS     GI/Hepatic Neg liver ROS, GERD  Medicated and Controlled,  Endo/Other  Hypothyroidism obese  Renal/GU negative Renal ROS     Musculoskeletal   Abdominal (+) + obese,   Peds  Hematology negative hematology ROS (+)   Anesthesia Other Findings   Reproductive/Obstetrics                           Anesthesia Physical Anesthesia Plan  ASA: III  Anesthesia Plan: Spinal   Post-op Pain Management:  Regional for Post-op pain   Induction:   PONV Risk Score and Plan: 2 and Ondansetron and Treatment may vary due to age or medical condition  Airway Management Planned: Natural Airway and Simple Face Mask  Additional Equipment: None  Intra-op Plan:   Post-operative Plan:   Informed Consent: I have reviewed the patients History and Physical, chart, labs and discussed the procedure including the risks, benefits and alternatives for the proposed anesthesia with the patient or authorized representative who has indicated his/her understanding and acceptance.     Dental advisory given  Plan Discussed with: CRNA and  Surgeon  Anesthesia Plan Comments: (Plan routine monitors, SAB with adductor canal block for post op analgesia)       Anesthesia Quick Evaluation

## 2020-03-09 NOTE — Anesthesia Procedure Notes (Signed)
Date/Time: 03/09/2020 9:19 AM Performed by: Cynda Familia, CRNA Pre-anesthesia Checklist: Patient identified, Suction available, Patient being monitored, Timeout performed and Emergency Drugs available Patient Re-evaluated:Patient Re-evaluated prior to induction Oxygen Delivery Method: Simple face mask Placement Confirmation: positive ETCO2 and breath sounds checked- equal and bilateral Dental Injury: Teeth and Oropharynx as per pre-operative assessment

## 2020-03-09 NOTE — H&P (Signed)
Gina Waller MRN:  [id number] DOB/SEX:  [birth year]/female  CHIEF COMPLAINT:  Painful right Knee  HISTORY: Patient is a 86 y.o. female presented with a history of pain in the right knee. Onset of symptoms was gradual starting a few years ago with gradually worsening course since that time. Patient has been treated conservatively with over-the-counter NSAIDs and activity modification. Patient currently rates pain in the knee at 10 out of 10 with activity. There is pain at night.  PAST MEDICAL HISTORY: There are no problems to display for this patient.  Past Medical History:  Diagnosis Date   Adenomatous colon polyp    Basal cell carcinoma of chest 2021   GERD (gastroesophageal reflux disease)    Hemorrhoids    High cholesterol    Hypertension    Hypothyroidism    Osteoarthritis    Pneumonia    as a child   Sleep apnea    Past Surgical History:  Procedure Laterality Date   BREAST REDUCTION SURGERY     At age 57   JOINT REPLACEMENT     TONSILLECTOMY     TOTAL KNEE ARTHROPLASTY       MEDICATIONS:   Medications Prior to Admission  Medication Sig Dispense Refill Last Dose   amLODipine (NORVASC) 5 MG tablet Take 5 mg by mouth daily.        aspirin EC 81 MG tablet Take 162 mg by mouth at bedtime as needed (sleep). Swallow whole.      Calcium Carbonate-Vit D-Min (CALCIUM 1200) 1200-1000 MG-UNIT CHEW Chew 1 tablet by mouth 2 (two) times daily.      cholecalciferol (VITAMIN D) 1000 UNITS tablet Take 1,000 Units by mouth daily.        cromolyn (OPTICROM) 4 % ophthalmic solution Place 1 drop into both eyes daily as needed (itching/burning).      FLUOCINOLONE ACETONIDE SCALP 0.01 % OIL Apply 1 application topically daily as needed (psoriasis).   2    levothyroxine (SYNTHROID, LEVOTHROID) 50 MCG tablet Take 50 mcg by mouth daily.        melatonin 5 MG TABS Take 2.5 mg by mouth at bedtime as needed (sleep).      metoprolol succinate (TOPROL-XL) 50 MG 24 hr  tablet Take 50 mg by mouth daily. Take with or immediately following a meal.      Multiple Vitamins-Minerals (MULTIVITAMINS THER. W/MINERALS) TABS Take 1 tablet by mouth daily.        Omega-3 Fatty Acids (FISH OIL TRIPLE STRENGTH) 1400 MG CAPS Take 1,400 mg by mouth every other day.      pantoprazole (PROTONIX) 40 MG tablet Take 40 mg by mouth daily.        rosuvastatin (CRESTOR) 20 MG tablet Take 20 mg by mouth daily.        telmisartan-hydrochlorothiazide (MICARDIS HCT) 80-12.5 MG per tablet Take 1 tablet by mouth daily.        NON FORMULARY CPAP, USE NIGHTLY FOR SLEEP APNEA       ALLERGIES:   Allergies  Allergen Reactions   Etodolac     stiffened arms   Lemonade Flavor [Flavoring Agent]     sneezing    REVIEW OF SYSTEMS:  Positive for MSK - arthralgias and bone pain   FAMILY HISTORY:   Family History  Problem Relation Age of Onset   Colon cancer Father     SOCIAL HISTORY:   Social History   Tobacco Use   Smoking status: Never Smoker   Smokeless tobacco:  Never Used  Substance Use Topics   Alcohol use: No     EXAMINATION:  Vital signs in last 24 hours:    There were no vitals taken for this visit.  General Appearance:    Alert, cooperative, no distress, appears stated age  Head:    Normocephalic, without obvious abnormality, atraumatic  Eyes:    PERRL, conjunctiva/corneas clear, EOM's intact, fundi    benign, both eyes  Ears:    Normal TM's and external ear canals, both ears  Nose:   Nares normal, septum midline, mucosa normal, no drainage    or sinus tenderness  Throat:   Lips, mucosa, and tongue normal; teeth and gums normal  Neck:   Supple, symmetrical, trachea midline, no adenopathy;    thyroid:  no enlargement/tenderness/nodules; no carotid   bruit or JVD  Back:     Symmetric, no curvature, ROM normal, no CVA tenderness  Lungs:     Clear to auscultation bilaterally, respirations unlabored  Chest Wall:    No tenderness or deformity   Heart:     Regular rate and rhythm, S1 and S2 normal, no murmur, rub   or gallop  Breast Exam:    No tenderness, masses, or nipple abnormality  Abdomen:     Soft, non-tender, bowel sounds active all four quadrants,    no masses, no organomegaly  Genitalia:    Normal female without lesion, discharge or tenderness  Rectal:    Normal tone no masses or tenderness;   guaiac negative stool  Extremities:   Extremities normal, atraumatic, no cyanosis or edema  Pulses:   2+ and symmetric all extremities  Skin:   Skin color, texture, turgor normal, no rashes or lesions  Lymph nodes:   Cervical, supraclavicular, and axillary nodes normal  Neurologic:   CNII-XII intact, normal strength, sensation and reflexes    throughout    Musculoskeletal:  ROM 0-120, Ligaments intact,  Imaging Review Plain radiographs demonstrate severe degenerative joint disease of the right knee. The overall alignment is neutral. The bone quality appears to be good for age and reported activity level.  Assessment/Plan: Primary osteoarthritis, right knee   The patient history, physical examination and imaging studies are consistent with advanced degenerative joint disease of the right knee. The patient has failed conservative treatment.  The clearance notes were reviewed.  After discussion with the patient it was felt that Total Knee Replacement was indicated. The procedure,  risks, and benefits of total knee arthroplasty were presented and reviewed. The risks including but not limited to aseptic loosening, infection, blood clots, vascular injury, stiffness, patella tracking problems complications among others were discussed. The patient acknowledged the explanation, agreed to proceed with the plan.  Preoperative templating of the joint replacement has been completed, documented, and submitted to the Operating Room personnel in order to optimize intra-operative equipment management.    Patient's anticipated LOS is less than 2 midnights,  meeting these requirements: - Lives within 1 hour of care - Has a competent adult at home to recover with post-op recover - NO history of  - Chronic pain requiring opiods  - Diabetes  - Coronary Artery Disease  - Heart failure  - Heart attack  - Stroke  - DVT/VTE  - Cardiac arrhythmia  - Respiratory Failure/COPD  - Renal failure  - Anemia  - Advanced Liver disease       Donia Ast 03/09/2020, 7:09 AM

## 2020-03-09 NOTE — Progress Notes (Signed)
AssistedDr. Carswell Jackson with right, ultrasound guided, adductor canal block. Side rails up, monitors on throughout procedure. See vital signs in flow sheet. Tolerated Procedure well.  

## 2020-03-09 NOTE — Progress Notes (Signed)
Orthopedic Tech Progress Note Patient Details:  Gina Waller 08/06/1927 [id number]  CPM Right Knee CPM Right Knee: On Right Knee Flexion (Degrees): 90 Right Knee Extension (Degrees): 0  Post Interventions Patient Tolerated: Well Instructions Provided: Adjustment of device  [name] 03/09/2020, 6:01 PM

## 2020-03-09 NOTE — Anesthesia Postprocedure Evaluation (Signed)
Anesthesia Post Note  Patient: [name]  Procedure(s) Performed: TOTAL KNEE ARTHROPLASTY (Right Knee)     Patient location during evaluation: PACU Anesthesia Type: Spinal Level of consciousness: awake and alert, patient cooperative and oriented Pain management: pain level controlled Vital Signs Assessment: post-procedure vital signs reviewed and stable Respiratory status: spontaneous breathing, nonlabored ventilation and respiratory function stable Cardiovascular status: stable and blood pressure returned to baseline Postop Assessment: no apparent nausea or vomiting, patient able to bend at knees and spinal receding Anesthetic complications: no   No complications documented.  Last Vitals:  Vitals:   03/09/20 1145 03/09/20 1200  BP: 122/66 125/65  Pulse: (!) 54 (!) 53  Resp: 19 20  Temp:    SpO2: 98% 97%    Last Pain:  Vitals:   03/09/20 1100  TempSrc:   PainSc: 0-No pain                 Chief Walkup,E. Caedmon Louque

## 2020-03-09 NOTE — Anesthesia Procedure Notes (Signed)
Anesthesia Regional Block: Adductor canal block   Pre-Anesthetic Checklist: ,, timeout performed, Correct Patient, Correct Site, Correct Laterality, Correct Procedure, Correct Position, site marked, Risks and benefits discussed,  Surgical consent,  Pre-op evaluation,  At surgeon's request and post-op pain management  Laterality: Right and Lower  Prep: chloraprep       Needles:  Injection technique: Single-shot  Needle Type: Echogenic Needle     Needle Length: 9cm  Needle Gauge: 21     Additional Needles:   Procedures:,,,, ultrasound used (permanent image in chart),,,,  Narrative:  Start time: 03/09/2020 8:48 AM End time: 03/09/2020 8:54 AM Injection made incrementally with aspirations every 5 mL.  Performed by: Personally  Anesthesiologist: Annye Asa, MD  Additional Notes: Pt identified in Holding room.  Monitors applied. Working IV access confirmed. Sterile prep R thigh.  #21ga ECHOgenic Arrow block needle into adductor canal with US guidance.  20cc 0.75% Ropivacaine injected incrementally after negative test dose.  Patient asymptomatic, VSS, no heme aspirated, tolerated well.  Jenita Seashore, MD

## 2020-03-09 NOTE — Transfer of Care (Signed)
Immediate Anesthesia Transfer of Care Note  Patient: Gina Waller  Procedure(s) Performed: TOTAL KNEE ARTHROPLASTY (Right Knee)  Patient Location: PACU  Anesthesia Type:Spinal  Level of Consciousness: awake and alert   Airway & Oxygen Therapy: Patient Spontanous Breathing and Patient connected to face mask oxygen  Post-op Assessment: Report given to RN and Post -op Vital signs reviewed and stable  Post vital signs: Reviewed and stable  Last Vitals:  Vitals Value Taken Time  BP 103/61 03/09/20 1100  Temp    Pulse 63 03/09/20 1101  Resp 22 03/09/20 1101  SpO2 99 % 03/09/20 1101  Vitals shown include unvalidated device data.  Last Pain:  Vitals:   03/09/20 0736  TempSrc:   PainSc: 0-No pain         Complications: No complications documented.

## 2020-03-09 NOTE — Plan of Care (Signed)
Plan of care reviewed and discussed with the patient. 

## 2020-03-09 NOTE — Anesthesia Procedure Notes (Signed)
Spinal  Patient location during procedure: OR End time: 03/09/2020 9:32 AM Staffing Performed: anesthesiologist  Anesthesiologist: Annye Asa, MD Preanesthetic Checklist Completed: patient identified, IV checked, site marked, risks and benefits discussed, surgical consent, monitors and equipment checked, pre-op evaluation and timeout performed Spinal Block Patient position: sitting Prep: DuraPrep and site prepped and draped Patient monitoring: blood pressure, continuous pulse ox, cardiac monitor and heart rate Approach: midline Location: L3-4 Injection technique: single-shot Needle Needle type: Pencan and Introducer  Needle gauge: 24 G Needle length: 9 cm Additional Notes Pt identified in Operating room.  Monitors applied. Working IV access confirmed. Sterile prep, drape lumbar spine.  1% lido local L 3,4.  #24ga Pencan into clear CSF L 3,4.  50mg  2% Mepivacaine injected with asp CSF beginning and end of injection.  Patient asymptomatic, VSS, no heme aspirated, tolerated well.  Jenita Seashore, MD

## 2020-03-10 ENCOUNTER — Encounter (HOSPITAL_COMMUNITY): Payer: Self-pay | Admitting: Orthopedic Surgery

## 2020-03-10 DIAGNOSIS — Z96651 Presence of right artificial knee joint: Secondary | ICD-10-CM | POA: Diagnosis not present

## 2020-03-10 DIAGNOSIS — M1711 Unilateral primary osteoarthritis, right knee: Secondary | ICD-10-CM | POA: Diagnosis not present

## 2020-03-10 LAB — BASIC METABOLIC PANEL
Anion gap: 12 (ref 5–15)
BUN: 14 mg/dL (ref 8–23)
CO2: 23 mmol/L (ref 22–32)
Calcium: 8.8 mg/dL — ABNORMAL LOW (ref 8.9–10.3)
Chloride: 101 mmol/L (ref 98–111)
Creatinine, Ser: 0.74 mg/dL (ref 0.44–1.00)
GFR, Estimated: >60 mL/min (ref >60)
Glucose, Bld: 128 mg/dL — ABNORMAL HIGH (ref 70–99)
Potassium: 3.3 mmol/L — ABNORMAL LOW (ref 3.5–5.1)
Sodium: 136 mmol/L (ref 135–145)

## 2020-03-10 LAB — CBC
HCT: 35.5 % — ABNORMAL LOW (ref 36.0–46.0)
Hemoglobin: 11.7 g/dL — ABNORMAL LOW (ref 12.0–15.0)
MCH: 29.7 pg (ref 26.0–34.0)
MCHC: 33 g/dL (ref 30.0–36.0)
MCV: 90.1 fL (ref 80.0–100.0)
Platelets: 267 10*3/uL (ref 150–400)
RBC: 3.94 MIL/uL (ref 3.87–5.11)
RDW: 13.3 % (ref 11.5–15.5)
WBC: 12.1 10*3/uL — ABNORMAL HIGH (ref 4.0–10.5)
nRBC: 0 % (ref 0.0–0.2)

## 2020-03-10 NOTE — Plan of Care (Signed)
  Problem: Education: Goal: Knowledge of General Education information will improve Description: Including pain rating scale, medication(s)/side effects and non-pharmacologic comfort measures Outcome: Progressing   Problem: Health Behavior/Discharge Planning: Goal: Ability to manage health-related needs will improve Outcome: Progressing   Problem: Clinical Measurements: Goal: Ability to maintain clinical measurements within normal limits will improve Outcome: Progressing Goal: Will remain free from infection Outcome: Progressing Goal: Diagnostic test results will improve Outcome: Progressing Goal: Respiratory complications will improve Outcome: Progressing Goal: Cardiovascular complication will be avoided Outcome: Progressing   Problem: Activity: Goal: Risk for activity intolerance will decrease Outcome: Progressing   Problem: Elimination: Goal: Will not experience complications related to bowel motility Outcome: Progressing   Problem: Pain Managment: Goal: General experience of comfort will improve Outcome: Progressing   Problem: Safety: Goal: Ability to remain free from injury will improve Outcome: Progressing   Problem: Skin Integrity: Goal: Risk for impaired skin integrity will decrease Outcome: Progressing   Problem: Education: Goal: Knowledge of the prescribed therapeutic regimen will improve Outcome: Progressing   Problem: Activity: Goal: Ability to avoid complications of mobility impairment will improve Outcome: Progressing Goal: Range of joint motion will improve Outcome: Progressing   Problem: Clinical Measurements: Goal: Postoperative complications will be avoided or minimized Outcome: Progressing   Problem: Pain Management: Goal: Pain level will decrease with appropriate interventions Outcome: Progressing   Problem: Skin Integrity: Goal: Will show signs of wound healing Outcome: Progressing

## 2020-03-10 NOTE — TOC Transition Note (Signed)
Transition of Care [facility] [place]) - CM/SW Discharge Note   Patient Details  Name: Gina Waller MRN: [id number] Date of Birth: 12/18/1927  [facility] CM/SW Contact:  Lia Hopping, [place] Phone Number: 03/10/2020, 10:34 AM   Clinical Narrative:    Therapy Plan: HHPT [facility] Confirm patient has DME.    Final next level of care: Home w Home Health Services Barriers to Discharge: No Barriers Identified   Patient Goals and CMS Choice   CMS Medicare.gov Compare Post Acute Care list provided to:: Other (Comment Required) (HHPT Prearranged in the Orthopedic office)    Discharge Placement                       Discharge Plan and Services                          HH Arranged: PT [place]: [facility] (formerly [employer]) Date [place]: 03/10/20 Time [place]: 1030 Representative spoke with at [place]: [place] ([place]) Interventions     Readmission Risk Interventions No flowsheet data found.

## 2020-03-10 NOTE — Progress Notes (Signed)
Physical Therapy Treatment Patient Details Name: Gina Waller MRN: [id number] DOB: 1927-07-18 Today's Date: 03/10/2020    History of Present Illness Patient is 86 y.o. female s/p Rt TKA on 03/09/20 with PMH significant for OA, HTN, hypothyroidism, GERD.    PT Comments    Pt niece present for PM therapy session. Niece instructed on how to guard pt safely and pt's proper gait pattern during gait on even surfaces and ascending/descending stairs for entry into the home; pt had difficulty recalling correct way to do stairs that was previously demonstrated and performed during AM therapy session. She did recall ascending sideways, which was performed with another therapist; we performed sideways on stairs to educate niece on proper self-placement and gait belt utilization when assisting pt. Niece educated and pt re-educated on exercises to perform at home; pt was able to recall almost all exercises previously gone over during AM session. She is able to perform sit to stand from chair with min G/ min A; able to recall proper hand placement during sit to stand; however, therapist changed hand placement to B UEs on the sitting surface for push off for safety and to prevent RW from tipping. Performs gait with min G/min A for 60 ft. Niece practiced holding gait belt and guarding throughout session. Both educated on correct usage of cryotherapy and the Zero Foam. Pt ready to go home; nursing notified.  Follow Up Recommendations  Follow surgeon's recommendation for DC plan and follow-up therapies;Home health PT     Equipment Recommendations  None recommended by PT    Recommendations for Other Services       Precautions / Restrictions Precautions Precautions: Fall    Mobility      Transfers Overall transfer level: Needs assistance Equipment used: Rolling walker (2 wheeled) Transfers: Sit to/from [facility] Sit to Stand: Min guard;Min assist Stand pivot transfers: Min  guard;Min assist       General transfer comment: VC's for hand placement for push off during sit to stand. Instructed to use B UEs to push off from seated surface; having one UE on RW causes RW to tilt.Unsafe  Ambulation/Gait Ambulation/Gait assistance: Min guard;Min assist Gait Distance (Feet): 60 Feet Assistive device: Rolling walker (2 wheeled) Gait Pattern/deviations: Step-to pattern;Decreased stride length;Trunk flexed Gait velocity: decr   General Gait Details: VC's for safe step pattern and proximity to RW throughout.   Stairs   Stairs assistance: Min assist Stair Management: One rail Right;Step to pattern;Sideways Number of Stairs: 2 General stair comments: Pt had trouble recalling direction for ascending stairs this AM during session, but seh recalls going up sideways with another therapist so we practiced sideways ascening/descending   Wheelchair Mobility    Modified Rankin (Stroke Patients Only)       Balance                                            Cognition Arousal/Alertness: Awake/alert Behavior During Therapy: WFL for tasks assessed/performed Overall Cognitive Status: Within Functional Limits for tasks assessed                                        Exercises Total Joint Exercises Ankle Circles/Pumps: AROM;Both;10 reps Quad Sets: AROM;Right;10 reps;Supine Towel Squeeze: AROM;Both;Supine;5 reps Short Arc Quad: Right;AAROM;Supine;5 reps Heel Slides:  AAROM;Right;Supine;5 reps Hip ABduction/ADduction: AAROM;Right;Supine;5 reps Straight Leg Raises: AAROM;Right;Supine;5 reps    General Comments        Pertinent Vitals/Pain Pain Assessment: 0-10 Pain Score: 3  Pain Location: R knee Pain Descriptors / Indicators: Sore;Grimacing Pain Intervention(s): Monitored during session    Home Living                      Prior Function            PT Goals (current goals can now be found in the care plan  section) Acute Rehab PT Goals Patient Stated Goal: recover back to independence PT Goal Formulation: With patient Time For Goal Achievement: 03/16/20 Potential to Achieve Goals: Good    Frequency    7X/week      PT Plan      Co-evaluation              AM-PAC PT "6 Clicks" Mobility   Outcome Measure  Help needed turning from your back to your side while in a flat bed without using bedrails?: A Little Help needed moving from lying on your back to sitting on the side of a flat bed without using bedrails?: A Little Help needed moving to and from a bed to a chair (including a wheelchair)?: A Little Help needed standing up from a chair using your arms (e.g., wheelchair or bedside chair)?: A Little Help needed to walk in hospital room?: A Little Help needed climbing 3-5 steps with a railing? : A Little 6 Click Score: 18    End of Session Equipment Utilized During Treatment: Gait belt Activity Tolerance: Patient tolerated treatment well Patient left: in chair;with family/visitor present;with nursing/sitter in room Nurse Communication: Mobility status PT Visit Diagnosis: Muscle weakness (generalized) (M62.81);Difficulty in walking, not elsewhere classified (R26.2)     Time: 9458-5929 PT Time Calculation (min) (ACUTE ONLY): 26 min  Charges:  $Gait Training: 8-22 mins $Therapeutic Exercise: 8-22 mins                     C. Parks Neptune, [facility] [phone]

## 2020-03-10 NOTE — Progress Notes (Signed)
SPORTS MEDICINE AND JOINT REPLACEMENT  Lara Mulch, MD    Carlyon Shadow, PA-C [place], [place], [place]  [address]                             [phone]   PROGRESS NOTE  Subjective:  negative for Chest Pain  negative for Shortness of Breath  negative for Nausea/Vomiting   negative for Calf Pain  negative for Bowel Movement   Tolerating Diet: yes         Patient reports pain as 3 on 0-10 scale.    Objective: Vital signs in last 24 hours:   Patient Vitals for the past 24 hrs:  BP Temp Temp src Pulse Resp SpO2  03/10/20 0935 117/68 98.3 F (36.8 C) -- 68 16 92 %  03/10/20 0535 129/70 98.6 F (37 C) Oral 64 17 92 %  03/10/20 0202 121/64 98.6 F (37 C) Oral 65 17 93 %  03/09/20 2111 (!) 141/78 98.8 F (37.1 C) Oral 74 17 94 %  03/09/20 1714 (!) 141/74 97.8 F (36.6 C) Oral 85 17 94 %  03/09/20 1600 (!) 138/91 -- -- 86 16 96 %  03/09/20 1500 (!) 153/85 -- -- 83 16 94 %  03/09/20 1400 (!) 151/93 -- -- 72 16 92 %  03/09/20 1319 102/66 (!) 97.4 F (36.3 C) -- 68 20 93 %  03/09/20 1300 (!) 144/71 (!) 97.4 F (36.3 C) -- 62 (!) 22 93 %    @flow {1959:LAST@   Intake/Output from previous day:   11/01 0701 - 11/02 0700 In: 2872.2 [P.O.:348; I.V.:1874.2] Out: 1840 [JSEGB:1517]   Intake/Output this shift:   11/02 0701 - 11/02 1900 In: 475.7 [P.O.:250; I.V.:225.7] Out: -    Intake/Output      11/01 0701 - 11/02 0700 11/02 0701 - 11/03 0700   P.O. 348 250   I.V. (mL/kg) 1874.2 (23.2) 225.7 (2.8)   IV Piggyback 650    Total Intake(mL/kg) 2872.2 (35.5) 475.7 (5.9)   Urine (mL/kg/hr) 1800 (0.9)    Stool 0    Blood 40    Total Output 1840    Net +1032.2 +475.7        Urine Occurrence 3 x 2 x   Stool Occurrence 0 x 0 x   Emesis Occurrence 0 x       LABORATORY DATA: Recent Labs    03/10/20 0312  WBC 12.1*  HGB 11.7*  HCT 35.5*  PLT 267   Recent Labs    03/10/20 0312  NA 136  K 3.3*  CL 101  CO2 23  BUN 14  CREATININE 0.74  GLUCOSE 128*   CALCIUM 8.8*   Lab Results  Component Value Date   INR 1.02 07/04/2009    Examination:  General appearance: alert, cooperative and no distress Extremities: extremities normal, atraumatic, no cyanosis or edema  Wound Exam: clean, dry, intact   Drainage:  None: wound tissue dry  Motor Exam: Quadriceps and Hamstrings Intact  Sensory Exam: Superficial Peroneal, Deep Peroneal and Tibial normal   Assessment:    1 Day Post-Op  Procedure(s) (LRB): TOTAL KNEE ARTHROPLASTY (Right)  ADDITIONAL DIAGNOSIS:  Active Problems:   S/P total knee arthroplasty, right     Plan: Physical Therapy as ordered Weight Bearing as Tolerated (WBAT)  DVT Prophylaxis:  Aspirin  DISCHARGE PLAN: Home  DISCHARGE NEEDS: HHPT   patient doing well and ready for D/C home  Patient's anticipated LOS is less than 2 midnights, meeting these requirements: - Lives within 1 hour of care - Has a competent adult at home to recover with post-op recover - NO history of  - Chronic pain requiring opiods  - Diabetes  - Coronary Artery Disease  - Heart failure  - Heart attack  - Stroke  - DVT/VTE  - Cardiac arrhythmia  - Respiratory Failure/COPD  - Renal failure  - Anemia  - Advanced Liver disease        Donia Ast 03/10/2020, 12:59 PM

## 2020-03-10 NOTE — Discharge Summary (Signed)
SPORTS MEDICINE & JOINT REPLACEMENT   Lara Mulch, MD   Carlyon Shadow, PA-C [address], [place], [place]  [address]                             [phone]  PATIENT ID: Gina Waller        MRN:  [id number]          DOB/AGE: 1928/02/24 / 86 y.o.    DISCHARGE SUMMARY  ADMISSION DATE:    03/09/2020 DISCHARGE DATE:   03/10/2020   ADMISSION DIAGNOSIS: S/P total knee arthroplasty, right [Z96.651]    DISCHARGE DIAGNOSIS:  Osteoarthritis of right knee M17.11    ADDITIONAL DIAGNOSIS: Active Problems:   S/P total knee arthroplasty, right  Past Medical History:  Diagnosis Date  . Adenomatous colon polyp   . Basal cell carcinoma of chest 2021  . GERD (gastroesophageal reflux disease)   . Hemorrhoids   . High cholesterol   . Hypertension   . Hypothyroidism   . Osteoarthritis   . Pneumonia    as a child  . Sleep apnea     PROCEDURE: Procedure(s): TOTAL KNEE ARTHROPLASTY on 03/09/2020  CONSULTS:    HISTORY:  See H&P in chart  HOSPITAL COURSE:  Gina Waller is a 86 y.o. admitted on 03/09/2020 and found to have a diagnosis of Osteoarthritis of right knee M17.11.  After appropriate laboratory studies were obtained  they were taken to the operating room on 03/09/2020 and underwent Procedure(s): TOTAL KNEE ARTHROPLASTY.   They were given perioperative antibiotics:  Anti-infectives (From admission, onward)   Start     Dose/Rate Route Frequency Ordered Stop   03/09/20 1600  ceFAZolin (ANCEF) IVPB 2g/100 mL premix        2 g 200 mL/hr over 30 Minutes Intravenous Every 6 hours 03/09/20 1330 03/09/20 2200   03/09/20 1549  ceFAZolin (ANCEF) 2-4 GM/100ML-% IVPB       Note to Pharmacy: Marylyn Ishihara   : cabinet override      03/09/20 1549 03/10/20 0359   03/09/20 0715  ceFAZolin (ANCEF) IVPB 2g/100 mL premix        2 g 200 mL/hr over 30 Minutes Intravenous On call to O.R. 03/09/20 0708 03/09/20 1003    .  Patient given tranexamic acid IV or topical and exparel  intra-operatively.  Tolerated the procedure well.    POD# 1: Vital signs were stable.  Patient denied Chest pain, shortness of breath, or calf pain.  Patient was started on Aspirin twice daily at 8am.  Consults to PT, OT, and care management were made.  The patient was weight bearing as tolerated.  CPM was placed on the operative leg 0-90 degrees for 6-8 hours a day. When out of the CPM, patient was placed in the foam block to achieve full extension. Incentive spirometry was taught.  Dressing was changed.       POD #2, Continued  PT for ambulation and exercise program.  IV saline locked.  O2 discontinued.    The remainder of the hospital course was dedicated to ambulation and strengthening.   The patient was discharged on 1 Day Post-Op in  Good condition.  Blood products given:none  DIAGNOSTIC STUDIES: Recent vital signs:  Patient Vitals for the past 24 hrs:  BP Temp Temp src Pulse Resp SpO2  03/10/20 0935 117/68 98.3 F (36.8 C) -- 68 16 92 %  03/10/20 0535 129/70 98.6 F (37  C) Oral 64 17 92 %  03/10/20 0202 121/64 98.6 F (37 C) Oral 65 17 93 %  03/09/20 2111 (!) 141/78 98.8 F (37.1 C) Oral 74 17 94 %  03/09/20 1714 (!) 141/74 97.8 F (36.6 C) Oral 85 17 94 %  03/09/20 1600 (!) 138/91 -- -- 86 16 96 %  03/09/20 1500 (!) 153/85 -- -- 83 16 94 %  03/09/20 1400 (!) 151/93 -- -- 72 16 92 %  03/09/20 1319 102/66 (!) 97.4 F (36.3 C) -- 68 20 93 %       Recent laboratory studies: Recent Labs    03/10/20 0312  WBC 12.1*  HGB 11.7*  HCT 35.5*  PLT 267   Recent Labs    03/10/20 0312  NA 136  K 3.3*  CL 101  CO2 23  BUN 14  CREATININE 0.74  GLUCOSE 128*  CALCIUM 8.8*   Lab Results  Component Value Date   INR 1.02 07/04/2009     Recent Radiographic Studies :  No results found.  DISCHARGE INSTRUCTIONS: Discharge Instructions    Call MD / Call 911   Complete by: As directed    If you experience chest pain or shortness of breath, CALL 911 and be transported  to the hospital emergency room.  If you develope a fever above 101 F, pus (white drainage) or increased drainage or redness at the wound, or calf pain, call your surgeon's office.   Call MD / Call 911   Complete by: As directed    If you experience chest pain or shortness of breath, CALL 911 and be transported to the hospital emergency room.  If you develope a fever above 101 F, pus (white drainage) or increased drainage or redness at the wound, or calf pain, call your surgeon's office.   Constipation Prevention   Complete by: As directed    Drink plenty of fluids.  Prune juice may be helpful.  You may use a stool softener, such as Colace (over the counter) 100 mg twice a day.  Use MiraLax (over the counter) for constipation as needed.   Constipation Prevention   Complete by: As directed    Drink plenty of fluids.  Prune juice may be helpful.  You may use a stool softener, such as Colace (over the counter) 100 mg twice a day.  Use MiraLax (over the counter) for constipation as needed.   Diet - low sodium heart healthy   Complete by: As directed    Diet - low sodium heart healthy   Complete by: As directed    Discharge instructions   Complete by: As directed    INSTRUCTIONS AFTER JOINT REPLACEMENT   Remove items at home which could result in a fall. This includes throw rugs or furniture in walking pathways ICE to the affected joint every three hours while awake for 30 minutes at a time, for at least the first 3-5 days, and then as needed for pain and swelling.  Continue to use ice for pain and swelling. You may notice swelling that will progress down to the foot and ankle.  This is normal after surgery.  Elevate your leg when you are not up walking on it.   Continue to use the breathing machine you got in the hospital (incentive spirometer) which will help keep your temperature down.  It is common for your temperature to cycle up and down following surgery, especially at night when you are not up  moving around and  exerting yourself.  The breathing machine keeps your lungs expanded and your temperature down.   DIET:  As you were doing prior to hospitalization, we recommend a well-balanced diet.  DRESSING / WOUND CARE / SHOWERING  Keep the surgical dressing until follow up.  The dressing is water proof, so you can shower without any extra covering.  IF THE DRESSING FALLS OFF or the wound gets wet inside, change the dressing with sterile gauze.  Please use good hand washing techniques before changing the dressing.  Do not use any lotions or creams on the incision until instructed by your surgeon.    ACTIVITY  Increase activity slowly as tolerated, but follow the weight bearing instructions below.   No driving for 6 weeks or until further direction given by your physician.  You cannot drive while taking narcotics.  No lifting or carrying greater than 10 lbs. until further directed by your surgeon. Avoid periods of inactivity such as sitting longer than an hour when not asleep. This helps prevent blood clots.  You may return to work once you are authorized by your doctor.     WEIGHT BEARING   Weight bearing as tolerated with assist device (walker, cane, etc) as directed, use it as long as suggested by your surgeon or therapist, typically at least 4-6 weeks.   EXERCISES  Results after joint replacement surgery are often greatly improved when you follow the exercise, range of motion and muscle strengthening exercises prescribed by your doctor. Safety measures are also important to protect the joint from further injury. Any time any of these exercises cause you to have increased pain or swelling, decrease what you are doing until you are comfortable again and then slowly increase them. If you have problems or questions, call your caregiver or physical therapist for advice.   Rehabilitation is important following a joint replacement. After just a few days of immobilization, the muscles of  the leg can become weakened and shrink (atrophy).  These exercises are designed to build up the tone and strength of the thigh and leg muscles and to improve motion. Often times heat used for twenty to thirty minutes before working out will loosen up your tissues and help with improving the range of motion but do not use heat for the first two weeks following surgery (sometimes heat can increase post-operative swelling).   These exercises can be done on a training (exercise) mat, on the floor, on a table or on a bed. Use whatever works the best and is most comfortable for you.    Use music or television while you are exercising so that the exercises are a pleasant break in your day. This will make your life better with the exercises acting as a break in your routine that you can look forward to.   Perform all exercises about fifteen times, three times per day or as directed.  You should exercise both the operative leg and the other leg as well.  Exercises include:   Quad Sets - Tighten up the muscle on the front of the thigh (Quad) and hold for 5-10 seconds.   Straight Leg Raises - With your knee straight (if you were given a brace, keep it on), lift the leg to 60 degrees, hold for 3 seconds, and slowly lower the leg.  Perform this exercise against resistance later as your leg gets stronger.  Leg Slides: Lying on your back, slowly slide your foot toward your buttocks, bending your knee up off the floor (  only go as far as is comfortable). Then slowly slide your foot back down until your leg is flat on the floor again.  Angel Wings: Lying on your back spread your legs to the side as far apart as you can without causing discomfort.  Hamstring Strength:  Lying on your back, push your heel against the floor with your leg straight by tightening up the muscles of your buttocks.  Repeat, but this time bend your knee to a comfortable angle, and push your heel against the floor.  You may put a pillow under the heel  to make it more comfortable if necessary.   A rehabilitation program following joint replacement surgery can speed recovery and prevent re-injury in the future due to weakened muscles. Contact your doctor or a physical therapist for more information on knee rehabilitation.    CONSTIPATION  Constipation is defined medically as fewer than three stools per week and severe constipation as less than one stool per week.  Even if you have a regular bowel pattern at home, your normal regimen is likely to be disrupted due to multiple reasons following surgery.  Combination of anesthesia, postoperative narcotics, change in appetite and fluid intake all can affect your bowels.   YOU MUST use at least one of the following options; they are listed in order of increasing strength to get the job done.  They are all available over the counter, and you may need to use some, POSSIBLY even all of these options:    Drink plenty of fluids (prune juice may be helpful) and high fiber foods Colace 100 mg by mouth twice a day  Senokot for constipation as directed and as needed Dulcolax (bisacodyl), take with full glass of water  Miralax (polyethylene glycol) once or twice a day as needed.  If you have tried all these things and are unable to have a bowel movement in the first 3-4 days after surgery call either your surgeon or your primary doctor.    If you experience loose stools or diarrhea, hold the medications until you stool forms back up.  If your symptoms do not get better within 1 week or if they get worse, check with your doctor.  If you experience "the worst abdominal pain ever" or develop nausea or vomiting, please contact the office immediately for further recommendations for treatment.   ITCHING:  If you experience itching with your medications, try taking only a single pain pill, or even half a pain pill at a time.  You can also use Benadryl over the counter for itching or also to help with sleep.   TED  HOSE STOCKINGS:  Use stockings on both legs until for at least 2 weeks or as directed by physician office. They may be removed at night for sleeping.  MEDICATIONS:  See your medication summary on the "After Visit Summary" that nursing will review with you.  You may have some home medications which will be placed on hold until you complete the course of blood thinner medication.  It is important for you to complete the blood thinner medication as prescribed.  PRECAUTIONS:  If you experience chest pain or shortness of breath - call 911 immediately for transfer to the hospital emergency department.   If you develop a fever greater that 101 F, purulent drainage from wound, increased redness or drainage from wound, foul odor from the wound/dressing, or calf pain - CONTACT YOUR SURGEON.  FOLLOW-UP APPOINTMENTS:  If you do not already have a post-op appointment, please call the office for an appointment to be seen by your surgeon.  Guidelines for how soon to be seen are listed in your "After Visit Summary", but are typically between 1-4 weeks after surgery.  OTHER INSTRUCTIONS:   Knee Replacement:  Do not place pillow under knee, focus on keeping the knee straight while resting. CPM instructions: 0-90 degrees, 2 hours in the morning, 2 hours in the afternoon, and 2 hours in the evening. Place foam block, curve side up under heel at all times except when in CPM or when walking.  DO NOT modify, tear, cut, or change the foam block in any way.   DENTAL ANTIBIOTICS:  In most cases prophylactic antibiotics for Dental procdeures after total joint surgery are not necessary.  Exceptions are as follows:  1. History of prior total joint infection  2. Severely immunocompromised (Organ Transplant, cancer chemotherapy, Rheumatoid biologic meds such as [place])  3. Poorly controlled diabetes (A1C &gt; 8.0, blood glucose over 200)  If you have one of these  conditions, contact your surgeon for an antibiotic prescription, prior to your dental procedure.   MAKE SURE YOU:  Understand these instructions.  Get help right away if you are not doing well or get worse.    Thank you for letting us be a part of your medical care team.  It is a privilege we respect greatly.  We hope these instructions will help you stay on track for a fast and full recovery!   Discharge instructions   Complete by: As directed    INSTRUCTIONS AFTER JOINT REPLACEMENT   Remove items at home which could result in a fall. This includes throw rugs or furniture in walking pathways ICE to the affected joint every three hours while awake for 30 minutes at a time, for at least the first 3-5 days, and then as needed for pain and swelling.  Continue to use ice for pain and swelling. You may notice swelling that will progress down to the foot and ankle.  This is normal after surgery.  Elevate your leg when you are not up walking on it.   Continue to use the breathing machine you got in the hospital (incentive spirometer) which will help keep your temperature down.  It is common for your temperature to cycle up and down following surgery, especially at night when you are not up moving around and exerting yourself.  The breathing machine keeps your lungs expanded and your temperature down.   DIET:  As you were doing prior to hospitalization, we recommend a well-balanced diet.  DRESSING / WOUND CARE / SHOWERING  Keep the surgical dressing until follow up.  The dressing is water proof, so you can shower without any extra covering.  IF THE DRESSING FALLS OFF or the wound gets wet inside, change the dressing with sterile gauze.  Please use good hand washing techniques before changing the dressing.  Do not use any lotions or creams on the incision until instructed by your surgeon.    ACTIVITY  Increase activity slowly as tolerated, but follow the weight bearing instructions below.   No  driving for 6 weeks or until further direction given by your physician.  You cannot drive while taking narcotics.  No lifting or carrying greater than 10 lbs. until further directed by your surgeon. Avoid periods of inactivity such as sitting longer than an hour when not asleep. This helps prevent blood clots.  You may return to work once you are authorized by your doctor.     WEIGHT BEARING   Weight bearing as tolerated with assist device (walker, cane, etc) as directed, use it as long as suggested by your surgeon or therapist, typically at least 4-6 weeks.   EXERCISES  Results after joint replacement surgery are often greatly improved when you follow the exercise, range of motion and muscle strengthening exercises prescribed by your doctor. Safety measures are also important to protect the joint from further injury. Any time any of these exercises cause you to have increased pain or swelling, decrease what you are doing until you are comfortable again and then slowly increase them. If you have problems or questions, call your caregiver or physical therapist for advice.   Rehabilitation is important following a joint replacement. After just a few days of immobilization, the muscles of the leg can become weakened and shrink (atrophy).  These exercises are designed to build up the tone and strength of the thigh and leg muscles and to improve motion. Often times heat used for twenty to thirty minutes before working out will loosen up your tissues and help with improving the range of motion but do not use heat for the first two weeks following surgery (sometimes heat can increase post-operative swelling).   These exercises can be done on a training (exercise) mat, on the floor, on a table or on a bed. Use whatever works the best and is most comfortable for you.    Use music or television while you are exercising so that the exercises are a pleasant break in your day. This will make your life better  with the exercises acting as a break in your routine that you can look forward to.   Perform all exercises about fifteen times, three times per day or as directed.  You should exercise both the operative leg and the other leg as well.  Exercises include:   Quad Sets - Tighten up the muscle on the front of the thigh (Quad) and hold for 5-10 seconds.   Straight Leg Raises - With your knee straight (if you were given a brace, keep it on), lift the leg to 60 degrees, hold for 3 seconds, and slowly lower the leg.  Perform this exercise against resistance later as your leg gets stronger.  Leg Slides: Lying on your back, slowly slide your foot toward your buttocks, bending your knee up off the floor (only go as far as is comfortable). Then slowly slide your foot back down until your leg is flat on the floor again.  Angel Wings: Lying on your back spread your legs to the side as far apart as you can without causing discomfort.  Hamstring Strength:  Lying on your back, push your heel against the floor with your leg straight by tightening up the muscles of your buttocks.  Repeat, but this time bend your knee to a comfortable angle, and push your heel against the floor.  You may put a pillow under the heel to make it more comfortable if necessary.   A rehabilitation program following joint replacement surgery can speed recovery and prevent re-injury in the future due to weakened muscles. Contact your doctor or a physical therapist for more information on knee rehabilitation.    CONSTIPATION  Constipation is defined medically as fewer than three stools per week and severe constipation as less than one stool per week.  Even if you have a regular bowel pattern at home,  your normal regimen is likely to be disrupted due to multiple reasons following surgery.  Combination of anesthesia, postoperative narcotics, change in appetite and fluid intake all can affect your bowels.   YOU MUST use at least one of the  following options; they are listed in order of increasing strength to get the job done.  They are all available over the counter, and you may need to use some, POSSIBLY even all of these options:    Drink plenty of fluids (prune juice may be helpful) and high fiber foods Colace 100 mg by mouth twice a day  Senokot for constipation as directed and as needed Dulcolax (bisacodyl), take with full glass of water  Miralax (polyethylene glycol) once or twice a day as needed.  If you have tried all these things and are unable to have a bowel movement in the first 3-4 days after surgery call either your surgeon or your primary doctor.    If you experience loose stools or diarrhea, hold the medications until you stool forms back up.  If your symptoms do not get better within 1 week or if they get worse, check with your doctor.  If you experience "the worst abdominal pain ever" or develop nausea or vomiting, please contact the office immediately for further recommendations for treatment.   ITCHING:  If you experience itching with your medications, try taking only a single pain pill, or even half a pain pill at a time.  You can also use Benadryl over the counter for itching or also to help with sleep.   TED HOSE STOCKINGS:  Use stockings on both legs until for at least 2 weeks or as directed by physician office. They may be removed at night for sleeping.  MEDICATIONS:  See your medication summary on the "After Visit Summary" that nursing will review with you.  You may have some home medications which will be placed on hold until you complete the course of blood thinner medication.  It is important for you to complete the blood thinner medication as prescribed.  PRECAUTIONS:  If you experience chest pain or shortness of breath - call 911 immediately for transfer to the hospital emergency department.   If you develop a fever greater that 101 F, purulent drainage from wound, increased redness or drainage from  wound, foul odor from the wound/dressing, or calf pain - CONTACT YOUR SURGEON.                                                   FOLLOW-UP APPOINTMENTS:  If you do not already have a post-op appointment, please call the office for an appointment to be seen by your surgeon.  Guidelines for how soon to be seen are listed in your "After Visit Summary", but are typically between 1-4 weeks after surgery.  OTHER INSTRUCTIONS:   Knee Replacement:  Do not place pillow under knee, focus on keeping the knee straight while resting. CPM instructions: 0-90 degrees, 2 hours in the morning, 2 hours in the afternoon, and 2 hours in the evening. Place foam block, curve side up under heel at all times except when in CPM or when walking.  DO NOT modify, tear, cut, or change the foam block in any way.   DENTAL ANTIBIOTICS:  In most cases prophylactic antibiotics for Dental procdeures after total joint surgery are  not necessary.  Exceptions are as follows:  1. History of prior total joint infection  2. Severely immunocompromised (Organ Transplant, cancer chemotherapy, Rheumatoid biologic meds such as [place])  3. Poorly controlled diabetes (A1C &gt; 8.0, blood glucose over 200)  If you have one of these conditions, contact your surgeon for an antibiotic prescription, prior to your dental procedure.   MAKE SURE YOU:  Understand these instructions.  Get help right away if you are not doing well or get worse.    Thank you for letting us be a part of your medical care team.  It is a privilege we respect greatly.  We hope these instructions will help you stay on track for a fast and full recovery!   Increase activity slowly as tolerated   Complete by: As directed    Increase activity slowly as tolerated   Complete by: As directed       DISCHARGE MEDICATIONS:   Allergies as of 03/10/2020      Reactions   Etodolac    stiffened arms   Lemonade Flavor [flavoring Agent]    sneezing      Medication  List    TAKE these medications   amLODipine 5 MG tablet Commonly known as: NORVASC Take 5 mg by mouth daily.   aspirin EC 325 MG tablet Take 1 tablet (325 mg total) by mouth 2 (two) times daily for 15 days. What changed:   medication strength  how much to take  when to take this  reasons to take this  additional instructions   Calcium 1200 1200-1000 MG-UNIT Chew Chew 1 tablet by mouth 2 (two) times daily.   cholecalciferol 1000 units tablet Commonly known as: VITAMIN D Take 1,000 Units by mouth daily.   cromolyn 4 % ophthalmic solution Commonly known as: OPTICROM Place 1 drop into both eyes daily as needed (itching/burning).   Fish Oil Triple Strength 1400 MG Caps Take 1,400 mg by mouth every other day.   Fluocinolone Acetonide Scalp 0.01 % Oil Apply 1 application topically daily as needed (psoriasis).   levothyroxine 50 MCG tablet Commonly known as: SYNTHROID Take 50 mcg by mouth daily.   melatonin 5 MG Tabs Take 2.5 mg by mouth at bedtime as needed (sleep).   metoprolol succinate 50 MG 24 hr tablet Commonly known as: TOPROL-XL Take 50 mg by mouth daily. Take with or immediately following a meal.   multivitamins ther. w/minerals Tabs tablet Take 1 tablet by mouth daily.   NON FORMULARY CPAP, USE NIGHTLY FOR SLEEP APNEA   oxyCODONE 5 MG immediate release tablet Commonly known as: Oxy IR/ROXICODONE Take 1-2 tablets (5-10 mg total) by mouth every 6 (six) hours as needed for severe pain.   pantoprazole 40 MG tablet Commonly known as: PROTONIX Take 40 mg by mouth daily.   rosuvastatin 20 MG tablet Commonly known as: CRESTOR Take 20 mg by mouth daily.   telmisartan-hydrochlorothiazide 80-12.5 MG tablet Commonly known as: MICARDIS HCT Take 1 tablet by mouth daily.   tiZANidine 2 MG tablet Commonly known as: ZANAFLEX Take 1 tablet (2 mg total) by mouth every 6 (six) hours as needed for muscle spasms.            Durable Medical Equipment  (From  admission, onward)         Start     Ordered   03/09/20 1717  DME Walker rolling  Once       Question:  Patient needs a walker to treat with the following  condition  Answer:  S/P total knee replacement   03/09/20 1716   03/09/20 1717  DME 3 n 1  Once        03/09/20 1716   03/09/20 1717  DME Bedside commode  Once       Question:  Patient needs a bedside commode to treat with the following condition  Answer:  S/P total knee replacement   03/09/20 1716          FOLLOW UP VISIT:    DISPOSITION: HOME VS. SNF  Dental Antibiotics:  In most cases prophylactic antibiotics for Dental procdeures after total joint surgery are not necessary.  Exceptions are as follows:  1. History of prior total joint infection  2. Severely immunocompromised (Organ Transplant, cancer chemotherapy, Rheumatoid biologic meds such as [place])  3. Poorly controlled diabetes (A1C &gt; 8.0, blood glucose over 200)  If you have one of these conditions, contact your surgeon for an antibiotic prescription, prior to your dental procedure.   CONDITION:  Good   Donia Ast 03/10/2020, 1:00 PM

## 2020-03-10 NOTE — Progress Notes (Signed)
Physical Therapy Treatment Patient Details Name: Gina Waller MRN: [id number] DOB: [birth year] Today's Date: 03/10/2020    History of Present Illness Patient is 86 y.o. female s/p Rt TKA on 03/09/20 with PMH significant for OA, HTN, hypothyroidism, GERD.    PT Comments    Pt found supine in bed, excited for therapy. She is able to complete supine to sit with supervision using gait belt strap to assist her R LE to the floor. She requires VC's for correct and safe hand placement during push off for sit to stand; min G/min A. Once, ambulating, patient is steady on her feet with no LOB; min G. VC's to remind her to keep a safe distance from the RW x 2. She demos tendency to step in too close to RW. Pt ascends and descends 2 stairs steadily with no LOB. She ascends with the correct LE and requires assist and education on correct leading LE for descending stairs. Ambulates for 22ft in hallway. Attempts to perform standing pivot transfer into chair without using RW; pt is stopped, corrected, and educated on safety and proper use of RW during stand to sit. Plan to visit pt again today when niece is present for family practice and education on safety.  Follow Up Recommendations  Follow surgeon's recommendation for DC plan and follow-up therapies;Home health PT     Equipment Recommendations  None recommended by PT    Recommendations for Other Services       Precautions / Restrictions Precautions Precautions: Fall    Mobility  Bed Mobility Overal bed mobility: Needs Assistance Bed Mobility: Supine to Sit     Supine to sit: Supervision     General bed mobility comments: Pt utilized gait belt as a strap to assist in maneuvering R LE during supine to sit; extra time to raise trunk to midline  Transfers Overall transfer level: Needs assistance Equipment used: Rolling walker (2 wheeled) Transfers: Sit to/from [facility] Sit to Stand: Min guard;Min assist Stand pivot  transfers: Min guard;Min assist       General transfer comment: VCs for hand placement for push off during sit to stand  Ambulation/Gait Ambulation/Gait assistance: Min guard Gait Distance (Feet): 60 Feet Assistive device: Rolling walker (2 wheeled) Gait Pattern/deviations: Step-to pattern;Decreased stride length;Trunk flexed     General Gait Details: VC's for safe step pattern and proximity to RW throughout.   Stairs   Stairs assistance: Min assist Stair Management: One rail Right;Step to pattern;Forwards Number of Stairs: 2 General stair comments: Pt performed 2 stairs up and 2 down, forward facing.   Wheelchair Mobility    Modified Rankin (Stroke Patients Only)       Balance                                            Cognition Arousal/Alertness: Awake/alert Behavior During Therapy: WFL for tasks assessed/performed Overall Cognitive Status: Within Functional Limits for tasks assessed                                        Exercises Total Joint Exercises Ankle Circles/Pumps: AROM;Both;20 reps;Supine Quad Sets: AROM;Right;10 reps;Supine Towel Squeeze: AROM;Both;10 reps;Supine Short Arc Quad: Right;AAROM;10 reps;Supine Heel Slides: AAROM;Right;Supine;10 reps Hip ABduction/ADduction: AAROM;Right;Supine;10 reps Straight Leg Raises: AAROM;Right;10 reps;Supine    General  Comments        Pertinent Vitals/Pain Pain Assessment: 0-10 Pain Score: 4  Pain Location: R knee Pain Descriptors / Indicators: Grimacing;Sore Pain Intervention(s): Monitored during session;Ice applied    Home Living                      Prior Function            PT Goals (current goals can now be found in the care plan section) Acute Rehab PT Goals Patient Stated Goal: recover back to independence PT Goal Formulation: With patient Time For Goal Achievement: 03/16/20 Potential to Achieve Goals: Good    Frequency    7X/week       PT Plan      Co-evaluation              AM-PAC PT "6 Clicks" Mobility   Outcome Measure  Help needed turning from your back to your side while in a flat bed without using bedrails?: A Little Help needed moving from lying on your back to sitting on the side of a flat bed without using bedrails?: A Little Help needed moving to and from a bed to a chair (including a wheelchair)?: A Little Help needed standing up from a chair using your arms (e.g., wheelchair or bedside chair)?: A Little Help needed to walk in hospital room?: A Little Help needed climbing 3-5 steps with a railing? : A Little 6 Click Score: 18    End of Session Equipment Utilized During Treatment: Gait belt Activity Tolerance: Patient tolerated treatment well Patient left: with call bell/phone within reach;in chair Nurse Communication: Mobility status PT Visit Diagnosis: Muscle weakness (generalized) (M62.81);Difficulty in walking, not elsewhere classified (R26.2)     Time:  -     Charges:                        Juel Burrow, [place] [facility] [phone]

## 2020-03-11 DIAGNOSIS — E039 Hypothyroidism, unspecified: Secondary | ICD-10-CM | POA: Diagnosis not present

## 2020-03-11 DIAGNOSIS — Z8701 Personal history of pneumonia (recurrent): Secondary | ICD-10-CM | POA: Diagnosis not present

## 2020-03-11 DIAGNOSIS — Z96651 Presence of right artificial knee joint: Secondary | ICD-10-CM | POA: Diagnosis not present

## 2020-03-11 DIAGNOSIS — G4730 Sleep apnea, unspecified: Secondary | ICD-10-CM | POA: Diagnosis not present

## 2020-03-11 DIAGNOSIS — I10 Essential (primary) hypertension: Secondary | ICD-10-CM | POA: Diagnosis not present

## 2020-03-11 DIAGNOSIS — Z86010 Personal history of colonic polyps: Secondary | ICD-10-CM | POA: Diagnosis not present

## 2020-03-11 DIAGNOSIS — E7800 Pure hypercholesterolemia, unspecified: Secondary | ICD-10-CM | POA: Diagnosis not present

## 2020-03-11 DIAGNOSIS — Z85828 Personal history of other malignant neoplasm of skin: Secondary | ICD-10-CM | POA: Diagnosis not present

## 2020-03-11 DIAGNOSIS — Z471 Aftercare following joint replacement surgery: Secondary | ICD-10-CM | POA: Diagnosis not present

## 2020-03-11 DIAGNOSIS — K649 Unspecified hemorrhoids: Secondary | ICD-10-CM | POA: Diagnosis not present

## 2020-03-11 DIAGNOSIS — K219 Gastro-esophageal reflux disease without esophagitis: Secondary | ICD-10-CM | POA: Diagnosis not present

## 2020-03-19 DIAGNOSIS — M25661 Stiffness of right knee, not elsewhere classified: Secondary | ICD-10-CM | POA: Diagnosis not present

## 2020-03-19 DIAGNOSIS — R29898 Other symptoms and signs involving the musculoskeletal system: Secondary | ICD-10-CM | POA: Diagnosis not present

## 2020-03-19 DIAGNOSIS — M25461 Effusion, right knee: Secondary | ICD-10-CM | POA: Diagnosis not present

## 2020-03-19 DIAGNOSIS — Z96651 Presence of right artificial knee joint: Secondary | ICD-10-CM | POA: Diagnosis not present

## 2020-03-19 DIAGNOSIS — M25561 Pain in right knee: Secondary | ICD-10-CM | POA: Diagnosis not present

## 2020-03-19 DIAGNOSIS — Z7409 Other reduced mobility: Secondary | ICD-10-CM | POA: Diagnosis not present

## 2020-03-23 DIAGNOSIS — R29898 Other symptoms and signs involving the musculoskeletal system: Secondary | ICD-10-CM | POA: Diagnosis not present

## 2020-03-23 DIAGNOSIS — M25461 Effusion, right knee: Secondary | ICD-10-CM | POA: Diagnosis not present

## 2020-03-23 DIAGNOSIS — Z96651 Presence of right artificial knee joint: Secondary | ICD-10-CM | POA: Diagnosis not present

## 2020-03-23 DIAGNOSIS — Z7409 Other reduced mobility: Secondary | ICD-10-CM | POA: Diagnosis not present

## 2020-03-23 DIAGNOSIS — M25661 Stiffness of right knee, not elsewhere classified: Secondary | ICD-10-CM | POA: Diagnosis not present

## 2020-03-23 DIAGNOSIS — M25561 Pain in right knee: Secondary | ICD-10-CM | POA: Diagnosis not present

## 2020-03-25 DIAGNOSIS — M25461 Effusion, right knee: Secondary | ICD-10-CM | POA: Diagnosis not present

## 2020-03-25 DIAGNOSIS — Z96651 Presence of right artificial knee joint: Secondary | ICD-10-CM | POA: Diagnosis not present

## 2020-03-25 DIAGNOSIS — R29898 Other symptoms and signs involving the musculoskeletal system: Secondary | ICD-10-CM | POA: Diagnosis not present

## 2020-03-25 DIAGNOSIS — Z7409 Other reduced mobility: Secondary | ICD-10-CM | POA: Diagnosis not present

## 2020-03-25 DIAGNOSIS — M25561 Pain in right knee: Secondary | ICD-10-CM | POA: Diagnosis not present

## 2020-03-25 DIAGNOSIS — M25661 Stiffness of right knee, not elsewhere classified: Secondary | ICD-10-CM | POA: Diagnosis not present

## 2020-03-30 DIAGNOSIS — R29898 Other symptoms and signs involving the musculoskeletal system: Secondary | ICD-10-CM | POA: Diagnosis not present

## 2020-03-30 DIAGNOSIS — Z96651 Presence of right artificial knee joint: Secondary | ICD-10-CM | POA: Diagnosis not present

## 2020-03-30 DIAGNOSIS — M25661 Stiffness of right knee, not elsewhere classified: Secondary | ICD-10-CM | POA: Diagnosis not present

## 2020-03-30 DIAGNOSIS — M25561 Pain in right knee: Secondary | ICD-10-CM | POA: Diagnosis not present

## 2020-03-30 DIAGNOSIS — M25461 Effusion, right knee: Secondary | ICD-10-CM | POA: Diagnosis not present

## 2020-03-30 DIAGNOSIS — Z7409 Other reduced mobility: Secondary | ICD-10-CM | POA: Diagnosis not present

## 2020-04-01 DIAGNOSIS — Z96651 Presence of right artificial knee joint: Secondary | ICD-10-CM | POA: Diagnosis not present

## 2020-04-01 DIAGNOSIS — M25561 Pain in right knee: Secondary | ICD-10-CM | POA: Diagnosis not present

## 2020-04-01 DIAGNOSIS — M25661 Stiffness of right knee, not elsewhere classified: Secondary | ICD-10-CM | POA: Diagnosis not present

## 2020-04-01 DIAGNOSIS — M25461 Effusion, right knee: Secondary | ICD-10-CM | POA: Diagnosis not present

## 2020-04-01 DIAGNOSIS — R29898 Other symptoms and signs involving the musculoskeletal system: Secondary | ICD-10-CM | POA: Diagnosis not present

## 2020-04-01 DIAGNOSIS — M859 Disorder of bone density and structure, unspecified: Secondary | ICD-10-CM | POA: Diagnosis not present

## 2020-04-01 DIAGNOSIS — E785 Hyperlipidemia, unspecified: Secondary | ICD-10-CM | POA: Diagnosis not present

## 2020-04-01 DIAGNOSIS — Z7409 Other reduced mobility: Secondary | ICD-10-CM | POA: Diagnosis not present

## 2020-04-01 DIAGNOSIS — E039 Hypothyroidism, unspecified: Secondary | ICD-10-CM | POA: Diagnosis not present

## 2020-04-06 DIAGNOSIS — M25561 Pain in right knee: Secondary | ICD-10-CM | POA: Diagnosis not present

## 2020-04-06 DIAGNOSIS — M25661 Stiffness of right knee, not elsewhere classified: Secondary | ICD-10-CM | POA: Diagnosis not present

## 2020-04-06 DIAGNOSIS — Z96651 Presence of right artificial knee joint: Secondary | ICD-10-CM | POA: Diagnosis not present

## 2020-04-06 DIAGNOSIS — M25461 Effusion, right knee: Secondary | ICD-10-CM | POA: Diagnosis not present

## 2020-04-06 DIAGNOSIS — R29898 Other symptoms and signs involving the musculoskeletal system: Secondary | ICD-10-CM | POA: Diagnosis not present

## 2020-04-06 DIAGNOSIS — Z7409 Other reduced mobility: Secondary | ICD-10-CM | POA: Diagnosis not present

## 2020-04-08 DIAGNOSIS — M25561 Pain in right knee: Secondary | ICD-10-CM | POA: Diagnosis not present

## 2020-04-08 DIAGNOSIS — Z96651 Presence of right artificial knee joint: Secondary | ICD-10-CM | POA: Diagnosis not present

## 2020-04-08 DIAGNOSIS — Z7409 Other reduced mobility: Secondary | ICD-10-CM | POA: Diagnosis not present

## 2020-04-08 DIAGNOSIS — M25661 Stiffness of right knee, not elsewhere classified: Secondary | ICD-10-CM | POA: Diagnosis not present

## 2020-04-08 DIAGNOSIS — R29898 Other symptoms and signs involving the musculoskeletal system: Secondary | ICD-10-CM | POA: Diagnosis not present

## 2020-04-08 DIAGNOSIS — M25461 Effusion, right knee: Secondary | ICD-10-CM | POA: Diagnosis not present

## 2020-04-09 DIAGNOSIS — I10 Essential (primary) hypertension: Secondary | ICD-10-CM | POA: Diagnosis not present

## 2020-04-09 DIAGNOSIS — E669 Obesity, unspecified: Secondary | ICD-10-CM | POA: Diagnosis not present

## 2020-04-09 DIAGNOSIS — E039 Hypothyroidism, unspecified: Secondary | ICD-10-CM | POA: Diagnosis not present

## 2020-04-09 DIAGNOSIS — K449 Diaphragmatic hernia without obstruction or gangrene: Secondary | ICD-10-CM | POA: Diagnosis not present

## 2020-04-09 DIAGNOSIS — G4733 Obstructive sleep apnea (adult) (pediatric): Secondary | ICD-10-CM | POA: Diagnosis not present

## 2020-04-09 DIAGNOSIS — M1711 Unilateral primary osteoarthritis, right knee: Secondary | ICD-10-CM | POA: Diagnosis not present

## 2020-04-09 DIAGNOSIS — G4700 Insomnia, unspecified: Secondary | ICD-10-CM | POA: Diagnosis not present

## 2020-04-09 DIAGNOSIS — Z0000 Encounter for general adult medical examination without abnormal findings: Secondary | ICD-10-CM | POA: Diagnosis not present

## 2020-04-09 DIAGNOSIS — M8580 Other specified disorders of bone density and structure, unspecified site: Secondary | ICD-10-CM | POA: Diagnosis not present

## 2020-04-09 DIAGNOSIS — E785 Hyperlipidemia, unspecified: Secondary | ICD-10-CM | POA: Diagnosis not present

## 2020-04-09 DIAGNOSIS — N3281 Overactive bladder: Secondary | ICD-10-CM | POA: Diagnosis not present

## 2020-04-13 DIAGNOSIS — L853 Xerosis cutis: Secondary | ICD-10-CM | POA: Diagnosis not present

## 2020-04-13 DIAGNOSIS — R29898 Other symptoms and signs involving the musculoskeletal system: Secondary | ICD-10-CM | POA: Diagnosis not present

## 2020-04-13 DIAGNOSIS — Z96651 Presence of right artificial knee joint: Secondary | ICD-10-CM | POA: Diagnosis not present

## 2020-04-13 DIAGNOSIS — C44612 Basal cell carcinoma of skin of right upper limb, including shoulder: Secondary | ICD-10-CM | POA: Diagnosis not present

## 2020-04-13 DIAGNOSIS — M25661 Stiffness of right knee, not elsewhere classified: Secondary | ICD-10-CM | POA: Diagnosis not present

## 2020-04-13 DIAGNOSIS — M25561 Pain in right knee: Secondary | ICD-10-CM | POA: Diagnosis not present

## 2020-04-13 DIAGNOSIS — Z7409 Other reduced mobility: Secondary | ICD-10-CM | POA: Diagnosis not present

## 2020-04-13 DIAGNOSIS — M25461 Effusion, right knee: Secondary | ICD-10-CM | POA: Diagnosis not present

## 2020-04-13 DIAGNOSIS — L821 Other seborrheic keratosis: Secondary | ICD-10-CM | POA: Diagnosis not present

## 2020-04-13 DIAGNOSIS — L570 Actinic keratosis: Secondary | ICD-10-CM | POA: Diagnosis not present

## 2020-04-20 DIAGNOSIS — N390 Urinary tract infection, site not specified: Secondary | ICD-10-CM | POA: Diagnosis not present

## 2020-04-22 DIAGNOSIS — M25561 Pain in right knee: Secondary | ICD-10-CM | POA: Diagnosis not present

## 2020-04-22 DIAGNOSIS — M25461 Effusion, right knee: Secondary | ICD-10-CM | POA: Diagnosis not present

## 2020-04-22 DIAGNOSIS — Z96651 Presence of right artificial knee joint: Secondary | ICD-10-CM | POA: Diagnosis not present

## 2020-04-22 DIAGNOSIS — R29898 Other symptoms and signs involving the musculoskeletal system: Secondary | ICD-10-CM | POA: Diagnosis not present

## 2020-04-22 DIAGNOSIS — Z7409 Other reduced mobility: Secondary | ICD-10-CM | POA: Diagnosis not present

## 2020-04-22 DIAGNOSIS — M25661 Stiffness of right knee, not elsewhere classified: Secondary | ICD-10-CM | POA: Diagnosis not present

## 2020-04-29 DIAGNOSIS — Z7409 Other reduced mobility: Secondary | ICD-10-CM | POA: Diagnosis not present

## 2020-04-29 DIAGNOSIS — M25661 Stiffness of right knee, not elsewhere classified: Secondary | ICD-10-CM | POA: Diagnosis not present

## 2020-04-29 DIAGNOSIS — R29898 Other symptoms and signs involving the musculoskeletal system: Secondary | ICD-10-CM | POA: Diagnosis not present

## 2020-04-29 DIAGNOSIS — Z96651 Presence of right artificial knee joint: Secondary | ICD-10-CM | POA: Diagnosis not present

## 2020-04-29 DIAGNOSIS — M25461 Effusion, right knee: Secondary | ICD-10-CM | POA: Diagnosis not present

## 2020-04-29 DIAGNOSIS — M25561 Pain in right knee: Secondary | ICD-10-CM | POA: Diagnosis not present

## 2020-05-19 DIAGNOSIS — H52203 Unspecified astigmatism, bilateral: Secondary | ICD-10-CM | POA: Diagnosis not present

## 2020-05-19 DIAGNOSIS — H524 Presbyopia: Secondary | ICD-10-CM | POA: Diagnosis not present

## 2020-05-19 DIAGNOSIS — Z961 Presence of intraocular lens: Secondary | ICD-10-CM | POA: Diagnosis not present

## 2020-05-20 DIAGNOSIS — H60501 Unspecified acute noninfective otitis externa, right ear: Secondary | ICD-10-CM | POA: Diagnosis not present

## 2020-05-20 DIAGNOSIS — H9201 Otalgia, right ear: Secondary | ICD-10-CM | POA: Diagnosis not present

## 2021-01-04 DIAGNOSIS — Z1231 Encounter for screening mammogram for malignant neoplasm of breast: Secondary | ICD-10-CM | POA: Diagnosis not present

## 2021-02-09 DIAGNOSIS — D229 Melanocytic nevi, unspecified: Secondary | ICD-10-CM | POA: Diagnosis not present

## 2021-02-09 DIAGNOSIS — L814 Other melanin hyperpigmentation: Secondary | ICD-10-CM | POA: Diagnosis not present

## 2021-02-09 DIAGNOSIS — Z23 Encounter for immunization: Secondary | ICD-10-CM | POA: Diagnosis not present

## 2021-02-09 DIAGNOSIS — L570 Actinic keratosis: Secondary | ICD-10-CM | POA: Diagnosis not present

## 2021-02-09 DIAGNOSIS — L820 Inflamed seborrheic keratosis: Secondary | ICD-10-CM | POA: Diagnosis not present

## 2021-02-09 DIAGNOSIS — D1801 Hemangioma of skin and subcutaneous tissue: Secondary | ICD-10-CM | POA: Diagnosis not present

## 2021-03-08 DIAGNOSIS — L820 Inflamed seborrheic keratosis: Secondary | ICD-10-CM | POA: Diagnosis not present

## 2021-03-08 DIAGNOSIS — Z872 Personal history of diseases of the skin and subcutaneous tissue: Secondary | ICD-10-CM | POA: Diagnosis not present

## 2021-03-08 DIAGNOSIS — Z23 Encounter for immunization: Secondary | ICD-10-CM | POA: Diagnosis not present

## 2021-05-07 DIAGNOSIS — E785 Hyperlipidemia, unspecified: Secondary | ICD-10-CM | POA: Diagnosis not present

## 2021-05-07 DIAGNOSIS — M8580 Other specified disorders of bone density and structure, unspecified site: Secondary | ICD-10-CM | POA: Diagnosis not present

## 2021-05-07 DIAGNOSIS — E039 Hypothyroidism, unspecified: Secondary | ICD-10-CM | POA: Diagnosis not present

## 2021-05-07 DIAGNOSIS — I10 Essential (primary) hypertension: Secondary | ICD-10-CM | POA: Diagnosis not present

## 2021-05-14 DIAGNOSIS — I10 Essential (primary) hypertension: Secondary | ICD-10-CM | POA: Diagnosis not present

## 2021-05-14 DIAGNOSIS — E669 Obesity, unspecified: Secondary | ICD-10-CM | POA: Diagnosis not present

## 2021-05-14 DIAGNOSIS — K449 Diaphragmatic hernia without obstruction or gangrene: Secondary | ICD-10-CM | POA: Diagnosis not present

## 2021-05-14 DIAGNOSIS — G4733 Obstructive sleep apnea (adult) (pediatric): Secondary | ICD-10-CM | POA: Diagnosis not present

## 2021-05-14 DIAGNOSIS — M069 Rheumatoid arthritis, unspecified: Secondary | ICD-10-CM | POA: Diagnosis not present

## 2021-05-14 DIAGNOSIS — E785 Hyperlipidemia, unspecified: Secondary | ICD-10-CM | POA: Diagnosis not present

## 2021-05-14 DIAGNOSIS — R82998 Other abnormal findings in urine: Secondary | ICD-10-CM | POA: Diagnosis not present

## 2021-05-14 DIAGNOSIS — M8580 Other specified disorders of bone density and structure, unspecified site: Secondary | ICD-10-CM | POA: Diagnosis not present

## 2021-05-14 DIAGNOSIS — Z0000 Encounter for general adult medical examination without abnormal findings: Secondary | ICD-10-CM | POA: Diagnosis not present

## 2021-05-14 DIAGNOSIS — N3281 Overactive bladder: Secondary | ICD-10-CM | POA: Diagnosis not present

## 2021-05-14 DIAGNOSIS — M48061 Spinal stenosis, lumbar region without neurogenic claudication: Secondary | ICD-10-CM | POA: Diagnosis not present

## 2021-05-14 DIAGNOSIS — G459 Transient cerebral ischemic attack, unspecified: Secondary | ICD-10-CM | POA: Diagnosis not present

## 2021-05-14 DIAGNOSIS — G4700 Insomnia, unspecified: Secondary | ICD-10-CM | POA: Diagnosis not present

## 2021-05-20 DIAGNOSIS — Z961 Presence of intraocular lens: Secondary | ICD-10-CM | POA: Diagnosis not present

## 2021-05-20 DIAGNOSIS — H353220 Exudative age-related macular degeneration, left eye, stage unspecified: Secondary | ICD-10-CM | POA: Diagnosis not present

## 2021-05-20 DIAGNOSIS — H52203 Unspecified astigmatism, bilateral: Secondary | ICD-10-CM | POA: Diagnosis not present

## 2021-05-20 DIAGNOSIS — H353111 Nonexudative age-related macular degeneration, right eye, early dry stage: Secondary | ICD-10-CM | POA: Diagnosis not present

## 2021-05-20 DIAGNOSIS — H524 Presbyopia: Secondary | ICD-10-CM | POA: Diagnosis not present

## 2021-05-21 DIAGNOSIS — H43813 Vitreous degeneration, bilateral: Secondary | ICD-10-CM | POA: Diagnosis not present

## 2021-05-21 DIAGNOSIS — H35451 Secondary pigmentary degeneration, right eye: Secondary | ICD-10-CM | POA: Diagnosis not present

## 2021-05-21 DIAGNOSIS — H353112 Nonexudative age-related macular degeneration, right eye, intermediate dry stage: Secondary | ICD-10-CM | POA: Diagnosis not present

## 2021-05-21 DIAGNOSIS — H353221 Exudative age-related macular degeneration, left eye, with active choroidal neovascularization: Secondary | ICD-10-CM | POA: Diagnosis not present

## 2021-05-31 DIAGNOSIS — H353221 Exudative age-related macular degeneration, left eye, with active choroidal neovascularization: Secondary | ICD-10-CM | POA: Diagnosis not present

## 2021-06-01 DIAGNOSIS — R102 Pelvic and perineal pain: Secondary | ICD-10-CM | POA: Diagnosis not present

## 2021-06-01 DIAGNOSIS — Z96651 Presence of right artificial knee joint: Secondary | ICD-10-CM | POA: Diagnosis not present

## 2021-06-29 DIAGNOSIS — H353112 Nonexudative age-related macular degeneration, right eye, intermediate dry stage: Secondary | ICD-10-CM | POA: Diagnosis not present

## 2021-06-29 DIAGNOSIS — H353221 Exudative age-related macular degeneration, left eye, with active choroidal neovascularization: Secondary | ICD-10-CM | POA: Diagnosis not present

## 2021-07-13 DIAGNOSIS — M1990 Unspecified osteoarthritis, unspecified site: Secondary | ICD-10-CM | POA: Diagnosis not present

## 2021-07-13 DIAGNOSIS — G4733 Obstructive sleep apnea (adult) (pediatric): Secondary | ICD-10-CM | POA: Diagnosis not present

## 2021-07-13 DIAGNOSIS — N390 Urinary tract infection, site not specified: Secondary | ICD-10-CM | POA: Diagnosis not present

## 2021-07-13 DIAGNOSIS — I10 Essential (primary) hypertension: Secondary | ICD-10-CM | POA: Diagnosis not present

## 2021-07-13 DIAGNOSIS — M542 Cervicalgia: Secondary | ICD-10-CM | POA: Diagnosis not present

## 2021-07-13 DIAGNOSIS — E669 Obesity, unspecified: Secondary | ICD-10-CM | POA: Diagnosis not present

## 2021-07-13 DIAGNOSIS — E785 Hyperlipidemia, unspecified: Secondary | ICD-10-CM | POA: Diagnosis not present

## 2021-07-13 DIAGNOSIS — E039 Hypothyroidism, unspecified: Secondary | ICD-10-CM | POA: Diagnosis not present

## 2021-07-13 DIAGNOSIS — K449 Diaphragmatic hernia without obstruction or gangrene: Secondary | ICD-10-CM | POA: Diagnosis not present

## 2021-07-13 DIAGNOSIS — N3281 Overactive bladder: Secondary | ICD-10-CM | POA: Diagnosis not present

## 2021-07-13 DIAGNOSIS — M8580 Other specified disorders of bone density and structure, unspecified site: Secondary | ICD-10-CM | POA: Diagnosis not present

## 2021-07-13 DIAGNOSIS — M1711 Unilateral primary osteoarthritis, right knee: Secondary | ICD-10-CM | POA: Diagnosis not present

## 2021-07-27 DIAGNOSIS — H353221 Exudative age-related macular degeneration, left eye, with active choroidal neovascularization: Secondary | ICD-10-CM | POA: Diagnosis not present

## 2021-07-27 DIAGNOSIS — H353112 Nonexudative age-related macular degeneration, right eye, intermediate dry stage: Secondary | ICD-10-CM | POA: Diagnosis not present

## 2021-08-31 DIAGNOSIS — H43813 Vitreous degeneration, bilateral: Secondary | ICD-10-CM | POA: Diagnosis not present

## 2021-08-31 DIAGNOSIS — H35451 Secondary pigmentary degeneration, right eye: Secondary | ICD-10-CM | POA: Diagnosis not present

## 2021-08-31 DIAGNOSIS — H353221 Exudative age-related macular degeneration, left eye, with active choroidal neovascularization: Secondary | ICD-10-CM | POA: Diagnosis not present

## 2021-08-31 DIAGNOSIS — H353112 Nonexudative age-related macular degeneration, right eye, intermediate dry stage: Secondary | ICD-10-CM | POA: Diagnosis not present

## 2021-10-12 DIAGNOSIS — H353221 Exudative age-related macular degeneration, left eye, with active choroidal neovascularization: Secondary | ICD-10-CM | POA: Diagnosis not present

## 2021-10-29 DIAGNOSIS — H60501 Unspecified acute noninfective otitis externa, right ear: Secondary | ICD-10-CM | POA: Diagnosis not present

## 2021-10-29 DIAGNOSIS — H9201 Otalgia, right ear: Secondary | ICD-10-CM | POA: Diagnosis not present

## 2021-11-20 DIAGNOSIS — R072 Precordial pain: Secondary | ICD-10-CM | POA: Diagnosis not present

## 2021-11-20 DIAGNOSIS — R079 Chest pain, unspecified: Secondary | ICD-10-CM | POA: Diagnosis not present

## 2021-11-20 DIAGNOSIS — R5383 Other fatigue: Secondary | ICD-10-CM | POA: Diagnosis not present

## 2021-12-13 DIAGNOSIS — H35451 Secondary pigmentary degeneration, right eye: Secondary | ICD-10-CM | POA: Diagnosis not present

## 2021-12-13 DIAGNOSIS — H43813 Vitreous degeneration, bilateral: Secondary | ICD-10-CM | POA: Diagnosis not present

## 2021-12-13 DIAGNOSIS — H353112 Nonexudative age-related macular degeneration, right eye, intermediate dry stage: Secondary | ICD-10-CM | POA: Diagnosis not present

## 2021-12-13 DIAGNOSIS — H353221 Exudative age-related macular degeneration, left eye, with active choroidal neovascularization: Secondary | ICD-10-CM | POA: Diagnosis not present

## 2021-12-28 DIAGNOSIS — I10 Essential (primary) hypertension: Secondary | ICD-10-CM | POA: Diagnosis not present

## 2021-12-28 DIAGNOSIS — K449 Diaphragmatic hernia without obstruction or gangrene: Secondary | ICD-10-CM | POA: Diagnosis not present

## 2021-12-28 DIAGNOSIS — R079 Chest pain, unspecified: Secondary | ICD-10-CM | POA: Diagnosis not present

## 2021-12-28 DIAGNOSIS — E039 Hypothyroidism, unspecified: Secondary | ICD-10-CM | POA: Diagnosis not present

## 2021-12-28 DIAGNOSIS — G4733 Obstructive sleep apnea (adult) (pediatric): Secondary | ICD-10-CM | POA: Diagnosis not present

## 2021-12-28 DIAGNOSIS — M8580 Other specified disorders of bone density and structure, unspecified site: Secondary | ICD-10-CM | POA: Diagnosis not present

## 2021-12-28 DIAGNOSIS — N3281 Overactive bladder: Secondary | ICD-10-CM | POA: Diagnosis not present

## 2021-12-28 DIAGNOSIS — M069 Rheumatoid arthritis, unspecified: Secondary | ICD-10-CM | POA: Diagnosis not present

## 2021-12-28 DIAGNOSIS — E669 Obesity, unspecified: Secondary | ICD-10-CM | POA: Diagnosis not present

## 2021-12-28 DIAGNOSIS — E785 Hyperlipidemia, unspecified: Secondary | ICD-10-CM | POA: Diagnosis not present

## 2021-12-28 DIAGNOSIS — G459 Transient cerebral ischemic attack, unspecified: Secondary | ICD-10-CM | POA: Diagnosis not present

## 2021-12-28 DIAGNOSIS — Z23 Encounter for immunization: Secondary | ICD-10-CM | POA: Diagnosis not present

## 2022-01-06 DIAGNOSIS — Z1231 Encounter for screening mammogram for malignant neoplasm of breast: Secondary | ICD-10-CM | POA: Diagnosis not present

## 2022-02-21 DIAGNOSIS — H35451 Secondary pigmentary degeneration, right eye: Secondary | ICD-10-CM | POA: Diagnosis not present

## 2022-02-21 DIAGNOSIS — H43813 Vitreous degeneration, bilateral: Secondary | ICD-10-CM | POA: Diagnosis not present

## 2022-02-21 DIAGNOSIS — H353112 Nonexudative age-related macular degeneration, right eye, intermediate dry stage: Secondary | ICD-10-CM | POA: Diagnosis not present

## 2022-02-21 DIAGNOSIS — H353221 Exudative age-related macular degeneration, left eye, with active choroidal neovascularization: Secondary | ICD-10-CM | POA: Diagnosis not present

## 2022-02-21 DIAGNOSIS — H35372 Puckering of macula, left eye: Secondary | ICD-10-CM | POA: Diagnosis not present

## 2022-05-17 DIAGNOSIS — H353221 Exudative age-related macular degeneration, left eye, with active choroidal neovascularization: Secondary | ICD-10-CM | POA: Diagnosis not present

## 2022-05-19 DIAGNOSIS — M8580 Other specified disorders of bone density and structure, unspecified site: Secondary | ICD-10-CM | POA: Diagnosis not present

## 2022-05-19 DIAGNOSIS — Z0000 Encounter for general adult medical examination without abnormal findings: Secondary | ICD-10-CM | POA: Diagnosis not present

## 2022-05-19 DIAGNOSIS — E039 Hypothyroidism, unspecified: Secondary | ICD-10-CM | POA: Diagnosis not present

## 2022-05-19 DIAGNOSIS — M069 Rheumatoid arthritis, unspecified: Secondary | ICD-10-CM | POA: Diagnosis not present

## 2022-05-19 DIAGNOSIS — M48062 Spinal stenosis, lumbar region with neurogenic claudication: Secondary | ICD-10-CM | POA: Diagnosis not present

## 2022-05-19 DIAGNOSIS — G4700 Insomnia, unspecified: Secondary | ICD-10-CM | POA: Diagnosis not present

## 2022-05-19 DIAGNOSIS — M542 Cervicalgia: Secondary | ICD-10-CM | POA: Diagnosis not present

## 2022-05-19 DIAGNOSIS — L409 Psoriasis, unspecified: Secondary | ICD-10-CM | POA: Diagnosis not present

## 2022-05-19 DIAGNOSIS — K449 Diaphragmatic hernia without obstruction or gangrene: Secondary | ICD-10-CM | POA: Diagnosis not present

## 2022-05-19 DIAGNOSIS — R82998 Other abnormal findings in urine: Secondary | ICD-10-CM | POA: Diagnosis not present

## 2022-05-19 DIAGNOSIS — M1711 Unilateral primary osteoarthritis, right knee: Secondary | ICD-10-CM | POA: Diagnosis not present

## 2022-05-19 DIAGNOSIS — I10 Essential (primary) hypertension: Secondary | ICD-10-CM | POA: Diagnosis not present

## 2022-05-19 DIAGNOSIS — E785 Hyperlipidemia, unspecified: Secondary | ICD-10-CM | POA: Diagnosis not present

## 2022-05-19 DIAGNOSIS — E669 Obesity, unspecified: Secondary | ICD-10-CM | POA: Diagnosis not present

## 2022-05-19 DIAGNOSIS — G4733 Obstructive sleep apnea (adult) (pediatric): Secondary | ICD-10-CM | POA: Diagnosis not present

## 2022-05-24 DIAGNOSIS — H5213 Myopia, bilateral: Secondary | ICD-10-CM | POA: Diagnosis not present

## 2022-05-24 DIAGNOSIS — H353220 Exudative age-related macular degeneration, left eye, stage unspecified: Secondary | ICD-10-CM | POA: Diagnosis not present

## 2022-05-24 DIAGNOSIS — H524 Presbyopia: Secondary | ICD-10-CM | POA: Diagnosis not present

## 2022-05-24 DIAGNOSIS — Z961 Presence of intraocular lens: Secondary | ICD-10-CM | POA: Diagnosis not present

## 2022-05-24 DIAGNOSIS — H353111 Nonexudative age-related macular degeneration, right eye, early dry stage: Secondary | ICD-10-CM | POA: Diagnosis not present

## 2022-05-24 DIAGNOSIS — H52203 Unspecified astigmatism, bilateral: Secondary | ICD-10-CM | POA: Diagnosis not present

## 2022-06-09 DIAGNOSIS — L400 Psoriasis vulgaris: Secondary | ICD-10-CM | POA: Diagnosis not present

## 2022-06-22 DIAGNOSIS — N390 Urinary tract infection, site not specified: Secondary | ICD-10-CM | POA: Diagnosis not present

## 2022-07-07 DIAGNOSIS — L400 Psoriasis vulgaris: Secondary | ICD-10-CM | POA: Diagnosis not present

## 2022-07-26 DIAGNOSIS — H35033 Hypertensive retinopathy, bilateral: Secondary | ICD-10-CM | POA: Diagnosis not present

## 2022-07-26 DIAGNOSIS — H353221 Exudative age-related macular degeneration, left eye, with active choroidal neovascularization: Secondary | ICD-10-CM | POA: Diagnosis not present

## 2022-07-26 DIAGNOSIS — H43813 Vitreous degeneration, bilateral: Secondary | ICD-10-CM | POA: Diagnosis not present

## 2022-07-26 DIAGNOSIS — H353231 Exudative age-related macular degeneration, bilateral, with active choroidal neovascularization: Secondary | ICD-10-CM | POA: Diagnosis not present

## 2022-07-26 DIAGNOSIS — H353112 Nonexudative age-related macular degeneration, right eye, intermediate dry stage: Secondary | ICD-10-CM | POA: Diagnosis not present

## 2022-08-27 DIAGNOSIS — R300 Dysuria: Secondary | ICD-10-CM | POA: Diagnosis not present

## 2022-08-27 DIAGNOSIS — N3000 Acute cystitis without hematuria: Secondary | ICD-10-CM | POA: Diagnosis not present

## 2022-09-21 DIAGNOSIS — R300 Dysuria: Secondary | ICD-10-CM | POA: Diagnosis not present

## 2022-09-21 DIAGNOSIS — N3091 Cystitis, unspecified with hematuria: Secondary | ICD-10-CM | POA: Diagnosis not present

## 2022-09-21 DIAGNOSIS — N3001 Acute cystitis with hematuria: Secondary | ICD-10-CM | POA: Diagnosis not present

## 2022-10-04 DIAGNOSIS — H353231 Exudative age-related macular degeneration, bilateral, with active choroidal neovascularization: Secondary | ICD-10-CM | POA: Diagnosis not present

## 2022-10-04 DIAGNOSIS — H43813 Vitreous degeneration, bilateral: Secondary | ICD-10-CM | POA: Diagnosis not present

## 2022-10-04 DIAGNOSIS — H35033 Hypertensive retinopathy, bilateral: Secondary | ICD-10-CM | POA: Diagnosis not present

## 2022-10-05 DIAGNOSIS — L400 Psoriasis vulgaris: Secondary | ICD-10-CM | POA: Diagnosis not present

## 2022-11-03 DIAGNOSIS — R300 Dysuria: Secondary | ICD-10-CM | POA: Diagnosis not present

## 2022-11-03 DIAGNOSIS — N3091 Cystitis, unspecified with hematuria: Secondary | ICD-10-CM | POA: Diagnosis not present

## 2022-11-14 DIAGNOSIS — H353211 Exudative age-related macular degeneration, right eye, with active choroidal neovascularization: Secondary | ICD-10-CM | POA: Diagnosis not present

## 2022-11-15 DIAGNOSIS — J302 Other seasonal allergic rhinitis: Secondary | ICD-10-CM | POA: Diagnosis not present

## 2022-11-15 DIAGNOSIS — M069 Rheumatoid arthritis, unspecified: Secondary | ICD-10-CM | POA: Diagnosis not present

## 2022-11-15 DIAGNOSIS — G4700 Insomnia, unspecified: Secondary | ICD-10-CM | POA: Diagnosis not present

## 2022-11-15 DIAGNOSIS — I10 Essential (primary) hypertension: Secondary | ICD-10-CM | POA: Diagnosis not present

## 2022-11-15 DIAGNOSIS — K449 Diaphragmatic hernia without obstruction or gangrene: Secondary | ICD-10-CM | POA: Diagnosis not present

## 2022-11-15 DIAGNOSIS — E785 Hyperlipidemia, unspecified: Secondary | ICD-10-CM | POA: Diagnosis not present

## 2022-11-15 DIAGNOSIS — N3281 Overactive bladder: Secondary | ICD-10-CM | POA: Diagnosis not present

## 2022-11-15 DIAGNOSIS — L409 Psoriasis, unspecified: Secondary | ICD-10-CM | POA: Diagnosis not present

## 2022-11-15 DIAGNOSIS — E039 Hypothyroidism, unspecified: Secondary | ICD-10-CM | POA: Diagnosis not present

## 2022-11-15 DIAGNOSIS — E669 Obesity, unspecified: Secondary | ICD-10-CM | POA: Diagnosis not present

## 2022-11-15 DIAGNOSIS — M48062 Spinal stenosis, lumbar region with neurogenic claudication: Secondary | ICD-10-CM | POA: Diagnosis not present

## 2022-11-15 DIAGNOSIS — M8580 Other specified disorders of bone density and structure, unspecified site: Secondary | ICD-10-CM | POA: Diagnosis not present

## 2022-12-28 DIAGNOSIS — H3530 Unspecified macular degeneration: Secondary | ICD-10-CM | POA: Diagnosis not present

## 2022-12-28 DIAGNOSIS — Z9842 Cataract extraction status, left eye: Secondary | ICD-10-CM | POA: Diagnosis not present

## 2022-12-28 DIAGNOSIS — H26491 Other secondary cataract, right eye: Secondary | ICD-10-CM | POA: Diagnosis not present

## 2022-12-28 DIAGNOSIS — H52223 Regular astigmatism, bilateral: Secondary | ICD-10-CM | POA: Diagnosis not present

## 2022-12-28 DIAGNOSIS — H5203 Hypermetropia, bilateral: Secondary | ICD-10-CM | POA: Diagnosis not present

## 2022-12-28 DIAGNOSIS — H524 Presbyopia: Secondary | ICD-10-CM | POA: Diagnosis not present

## 2022-12-28 DIAGNOSIS — H353132 Nonexudative age-related macular degeneration, bilateral, intermediate dry stage: Secondary | ICD-10-CM | POA: Diagnosis not present

## 2022-12-28 DIAGNOSIS — H18453 Nodular corneal degeneration, bilateral: Secondary | ICD-10-CM | POA: Diagnosis not present

## 2022-12-28 DIAGNOSIS — Z961 Presence of intraocular lens: Secondary | ICD-10-CM | POA: Diagnosis not present

## 2023-01-10 DIAGNOSIS — H353231 Exudative age-related macular degeneration, bilateral, with active choroidal neovascularization: Secondary | ICD-10-CM | POA: Diagnosis not present

## 2023-01-10 DIAGNOSIS — H35033 Hypertensive retinopathy, bilateral: Secondary | ICD-10-CM | POA: Diagnosis not present

## 2023-01-10 DIAGNOSIS — H43813 Vitreous degeneration, bilateral: Secondary | ICD-10-CM | POA: Diagnosis not present

## 2023-01-19 DIAGNOSIS — L209 Atopic dermatitis, unspecified: Secondary | ICD-10-CM | POA: Diagnosis not present

## 2023-01-19 DIAGNOSIS — L400 Psoriasis vulgaris: Secondary | ICD-10-CM | POA: Diagnosis not present

## 2023-02-28 DIAGNOSIS — N3000 Acute cystitis without hematuria: Secondary | ICD-10-CM | POA: Diagnosis not present

## 2023-02-28 DIAGNOSIS — R300 Dysuria: Secondary | ICD-10-CM | POA: Diagnosis not present

## 2023-03-20 DIAGNOSIS — L400 Psoriasis vulgaris: Secondary | ICD-10-CM | POA: Diagnosis not present

## 2023-03-21 DIAGNOSIS — H353231 Exudative age-related macular degeneration, bilateral, with active choroidal neovascularization: Secondary | ICD-10-CM | POA: Diagnosis not present

## 2023-03-27 DIAGNOSIS — R350 Frequency of micturition: Secondary | ICD-10-CM | POA: Diagnosis not present

## 2023-03-27 DIAGNOSIS — R319 Hematuria, unspecified: Secondary | ICD-10-CM | POA: Diagnosis not present

## 2023-03-27 DIAGNOSIS — N3001 Acute cystitis with hematuria: Secondary | ICD-10-CM | POA: Diagnosis not present

## 2023-03-27 DIAGNOSIS — R300 Dysuria: Secondary | ICD-10-CM | POA: Diagnosis not present

## 2023-04-07 DIAGNOSIS — N939 Abnormal uterine and vaginal bleeding, unspecified: Secondary | ICD-10-CM | POA: Diagnosis not present

## 2023-04-07 DIAGNOSIS — R319 Hematuria, unspecified: Secondary | ICD-10-CM | POA: Diagnosis not present

## 2023-04-07 DIAGNOSIS — N390 Urinary tract infection, site not specified: Secondary | ICD-10-CM | POA: Diagnosis not present

## 2023-04-10 ENCOUNTER — Other Ambulatory Visit (HOSPITAL_BASED_OUTPATIENT_CLINIC_OR_DEPARTMENT_OTHER): Payer: Self-pay | Admitting: Internal Medicine

## 2023-04-10 ENCOUNTER — Other Ambulatory Visit (HOSPITAL_COMMUNITY): Payer: Self-pay | Admitting: Internal Medicine

## 2023-04-10 ENCOUNTER — Ambulatory Visit (HOSPITAL_COMMUNITY)
Admission: RE | Admit: 2023-04-10 | Discharge: 2023-04-10 | Disposition: A | Discharge status: Home / Self Care | Payer: PPO | Source: Ambulatory Visit | Attending: Internal Medicine | Admitting: Internal Medicine

## 2023-04-10 DIAGNOSIS — N939 Abnormal uterine and vaginal bleeding, unspecified: Secondary | ICD-10-CM

## 2023-04-10 DIAGNOSIS — N888 Other specified noninflammatory disorders of cervix uteri: Secondary | ICD-10-CM | POA: Diagnosis not present

## 2023-04-17 ENCOUNTER — Other Ambulatory Visit (HOSPITAL_COMMUNITY): Payer: Self-pay | Admitting: Internal Medicine

## 2023-04-17 ENCOUNTER — Encounter: Payer: Self-pay | Admitting: Internal Medicine

## 2023-04-17 DIAGNOSIS — N839 Noninflammatory disorder of ovary, fallopian tube and broad ligament, unspecified: Secondary | ICD-10-CM

## 2023-04-19 ENCOUNTER — Ambulatory Visit (HOSPITAL_COMMUNITY)
Admission: RE | Admit: 2023-04-19 | Discharge: 2023-04-19 | Disposition: A | Discharge status: Home / Self Care | Payer: PPO | Source: Ambulatory Visit | Attending: Internal Medicine | Admitting: Internal Medicine

## 2023-04-19 DIAGNOSIS — K5730 Diverticulosis of large intestine without perforation or abscess without bleeding: Secondary | ICD-10-CM | POA: Diagnosis not present

## 2023-04-19 DIAGNOSIS — N839 Noninflammatory disorder of ovary, fallopian tube and broad ligament, unspecified: Secondary | ICD-10-CM

## 2023-04-19 DIAGNOSIS — N281 Cyst of kidney, acquired: Secondary | ICD-10-CM | POA: Diagnosis not present

## 2023-04-19 MED ORDER — IOHEXOL 300 MG/ML  SOLN
80.0000 mL | Freq: Once | INTRAMUSCULAR | Status: AC | PRN
Start: 2023-04-19 — End: 2023-04-19
  Administered 2023-04-19: 80 mL via INTRAVENOUS

## 2023-04-27 DIAGNOSIS — I440 Atrioventricular block, first degree: Secondary | ICD-10-CM

## 2023-04-27 DIAGNOSIS — G4730 Sleep apnea, unspecified: Secondary | ICD-10-CM | POA: Insufficient documentation

## 2023-04-27 DIAGNOSIS — M069 Rheumatoid arthritis, unspecified: Secondary | ICD-10-CM | POA: Insufficient documentation

## 2023-04-27 DIAGNOSIS — I10 Essential (primary) hypertension: Secondary | ICD-10-CM

## 2023-04-27 DIAGNOSIS — M5030 Other cervical disc degeneration, unspecified cervical region: Secondary | ICD-10-CM

## 2023-04-27 DIAGNOSIS — H3530 Unspecified macular degeneration: Secondary | ICD-10-CM | POA: Insufficient documentation

## 2023-04-27 DIAGNOSIS — R0789 Other chest pain: Secondary | ICD-10-CM | POA: Insufficient documentation

## 2023-04-27 DIAGNOSIS — K635 Polyp of colon: Secondary | ICD-10-CM

## 2023-04-27 DIAGNOSIS — H269 Unspecified cataract: Secondary | ICD-10-CM

## 2023-04-27 DIAGNOSIS — K449 Diaphragmatic hernia without obstruction or gangrene: Secondary | ICD-10-CM

## 2023-04-27 DIAGNOSIS — M8580 Other specified disorders of bone density and structure, unspecified site: Secondary | ICD-10-CM | POA: Insufficient documentation

## 2023-04-27 DIAGNOSIS — M48061 Spinal stenosis, lumbar region without neurogenic claudication: Secondary | ICD-10-CM

## 2023-04-27 DIAGNOSIS — K649 Unspecified hemorrhoids: Secondary | ICD-10-CM | POA: Insufficient documentation

## 2023-04-27 DIAGNOSIS — E785 Hyperlipidemia, unspecified: Secondary | ICD-10-CM

## 2023-04-27 DIAGNOSIS — N3281 Overactive bladder: Secondary | ICD-10-CM

## 2023-04-27 DIAGNOSIS — E039 Hypothyroidism, unspecified: Secondary | ICD-10-CM | POA: Insufficient documentation

## 2023-04-27 DIAGNOSIS — I872 Venous insufficiency (chronic) (peripheral): Secondary | ICD-10-CM | POA: Insufficient documentation

## 2023-04-27 HISTORY — DX: Atrioventricular block, first degree: I44.0

## 2023-04-27 HISTORY — DX: Rheumatoid arthritis, unspecified: M06.9

## 2023-04-27 HISTORY — DX: Unspecified cataract: H26.9

## 2023-04-27 HISTORY — DX: Other cervical disc degeneration, unspecified cervical region: M50.30

## 2023-04-27 HISTORY — DX: Other specified disorders of bone density and structure, unspecified site: M85.80

## 2023-04-27 HISTORY — DX: Hyperlipidemia, unspecified: E78.5

## 2023-04-27 HISTORY — DX: Diaphragmatic hernia without obstruction or gangrene: K44.9

## 2023-04-27 HISTORY — DX: Essential (primary) hypertension: I10

## 2023-04-27 HISTORY — DX: Other chest pain: R07.89

## 2023-04-27 HISTORY — DX: Hypercalcemia: E83.52

## 2023-04-27 HISTORY — DX: Spinal stenosis, lumbar region without neurogenic claudication: M48.061

## 2023-04-27 HISTORY — DX: Polyp of colon: K63.5

## 2023-04-27 HISTORY — DX: Overactive bladder: N32.81

## 2023-04-27 HISTORY — DX: Unspecified macular degeneration: H35.30

## 2023-04-28 ENCOUNTER — Other Ambulatory Visit: Payer: Self-pay | Admitting: Obstetrics and Gynecology

## 2023-04-28 DIAGNOSIS — N362 Urethral caruncle: Secondary | ICD-10-CM | POA: Diagnosis not present

## 2023-04-28 DIAGNOSIS — R1909 Other intra-abdominal and pelvic swelling, mass and lump: Secondary | ICD-10-CM

## 2023-04-28 DIAGNOSIS — N950 Postmenopausal bleeding: Secondary | ICD-10-CM | POA: Diagnosis not present

## 2023-04-28 DIAGNOSIS — N952 Postmenopausal atrophic vaginitis: Secondary | ICD-10-CM | POA: Diagnosis not present

## 2023-05-10 DIAGNOSIS — J189 Pneumonia, unspecified organism: Secondary | ICD-10-CM

## 2023-05-10 HISTORY — DX: Pneumonia, unspecified organism: J18.9

## 2023-05-14 DIAGNOSIS — R300 Dysuria: Secondary | ICD-10-CM | POA: Diagnosis not present

## 2023-05-14 DIAGNOSIS — R102 Pelvic and perineal pain: Secondary | ICD-10-CM | POA: Diagnosis not present

## 2023-05-14 DIAGNOSIS — N3091 Cystitis, unspecified with hematuria: Secondary | ICD-10-CM | POA: Diagnosis not present

## 2023-05-19 ENCOUNTER — Other Ambulatory Visit: Payer: PPO

## 2023-05-24 DIAGNOSIS — E039 Hypothyroidism, unspecified: Secondary | ICD-10-CM | POA: Diagnosis not present

## 2023-05-24 DIAGNOSIS — M8580 Other specified disorders of bone density and structure, unspecified site: Secondary | ICD-10-CM | POA: Diagnosis not present

## 2023-05-24 DIAGNOSIS — E785 Hyperlipidemia, unspecified: Secondary | ICD-10-CM | POA: Diagnosis not present

## 2023-05-24 DIAGNOSIS — K5900 Constipation, unspecified: Secondary | ICD-10-CM | POA: Diagnosis not present

## 2023-05-24 DIAGNOSIS — I10 Essential (primary) hypertension: Secondary | ICD-10-CM | POA: Diagnosis not present

## 2023-05-24 DIAGNOSIS — E559 Vitamin D deficiency, unspecified: Secondary | ICD-10-CM | POA: Diagnosis not present

## 2023-05-29 DIAGNOSIS — Z1152 Encounter for screening for COVID-19: Secondary | ICD-10-CM | POA: Diagnosis not present

## 2023-05-29 DIAGNOSIS — J13 Pneumonia due to Streptococcus pneumoniae: Secondary | ICD-10-CM | POA: Diagnosis not present

## 2023-05-29 DIAGNOSIS — D649 Anemia, unspecified: Secondary | ICD-10-CM | POA: Diagnosis not present

## 2023-05-29 DIAGNOSIS — A419 Sepsis, unspecified organism: Secondary | ICD-10-CM | POA: Diagnosis not present

## 2023-05-29 DIAGNOSIS — M810 Age-related osteoporosis without current pathological fracture: Secondary | ICD-10-CM | POA: Diagnosis not present

## 2023-05-29 DIAGNOSIS — J189 Pneumonia, unspecified organism: Secondary | ICD-10-CM | POA: Diagnosis not present

## 2023-05-29 DIAGNOSIS — Z7401 Bed confinement status: Secondary | ICD-10-CM | POA: Diagnosis not present

## 2023-05-29 DIAGNOSIS — N950 Postmenopausal bleeding: Secondary | ICD-10-CM | POA: Diagnosis not present

## 2023-05-29 DIAGNOSIS — R9431 Abnormal electrocardiogram [ECG] [EKG]: Secondary | ICD-10-CM | POA: Diagnosis not present

## 2023-05-29 DIAGNOSIS — G4730 Sleep apnea, unspecified: Secondary | ICD-10-CM | POA: Diagnosis not present

## 2023-05-29 DIAGNOSIS — R6520 Severe sepsis without septic shock: Secondary | ICD-10-CM | POA: Diagnosis not present

## 2023-05-29 DIAGNOSIS — N939 Abnormal uterine and vaginal bleeding, unspecified: Secondary | ICD-10-CM | POA: Diagnosis not present

## 2023-05-29 DIAGNOSIS — I4891 Unspecified atrial fibrillation: Secondary | ICD-10-CM | POA: Diagnosis not present

## 2023-05-29 DIAGNOSIS — G4733 Obstructive sleep apnea (adult) (pediatric): Secondary | ICD-10-CM | POA: Diagnosis not present

## 2023-05-29 DIAGNOSIS — R2681 Unsteadiness on feet: Secondary | ICD-10-CM | POA: Diagnosis not present

## 2023-05-29 DIAGNOSIS — K219 Gastro-esophageal reflux disease without esophagitis: Secondary | ICD-10-CM | POA: Diagnosis not present

## 2023-05-29 DIAGNOSIS — R059 Cough, unspecified: Secondary | ICD-10-CM | POA: Diagnosis not present

## 2023-05-29 DIAGNOSIS — R2689 Other abnormalities of gait and mobility: Secondary | ICD-10-CM | POA: Diagnosis not present

## 2023-05-29 DIAGNOSIS — E039 Hypothyroidism, unspecified: Secondary | ICD-10-CM | POA: Diagnosis not present

## 2023-05-29 DIAGNOSIS — I340 Nonrheumatic mitral (valve) insufficiency: Secondary | ICD-10-CM | POA: Diagnosis not present

## 2023-05-29 DIAGNOSIS — I351 Nonrheumatic aortic (valve) insufficiency: Secondary | ICD-10-CM | POA: Diagnosis not present

## 2023-05-29 DIAGNOSIS — J9601 Acute respiratory failure with hypoxia: Secondary | ICD-10-CM | POA: Diagnosis not present

## 2023-05-29 DIAGNOSIS — I10 Essential (primary) hypertension: Secondary | ICD-10-CM | POA: Diagnosis not present

## 2023-05-29 DIAGNOSIS — F419 Anxiety disorder, unspecified: Secondary | ICD-10-CM | POA: Diagnosis not present

## 2023-05-29 DIAGNOSIS — J9811 Atelectasis: Secondary | ICD-10-CM | POA: Diagnosis not present

## 2023-05-29 DIAGNOSIS — L409 Psoriasis, unspecified: Secondary | ICD-10-CM | POA: Diagnosis not present

## 2023-05-29 DIAGNOSIS — R0602 Shortness of breath: Secondary | ICD-10-CM | POA: Diagnosis not present

## 2023-05-29 DIAGNOSIS — R0902 Hypoxemia: Secondary | ICD-10-CM | POA: Diagnosis not present

## 2023-05-29 DIAGNOSIS — E785 Hyperlipidemia, unspecified: Secondary | ICD-10-CM | POA: Diagnosis not present

## 2023-05-29 DIAGNOSIS — Z79899 Other long term (current) drug therapy: Secondary | ICD-10-CM | POA: Diagnosis not present

## 2023-05-29 DIAGNOSIS — I361 Nonrheumatic tricuspid (valve) insufficiency: Secondary | ICD-10-CM | POA: Diagnosis not present

## 2023-05-29 DIAGNOSIS — R531 Weakness: Secondary | ICD-10-CM | POA: Diagnosis not present

## 2023-05-29 DIAGNOSIS — D500 Iron deficiency anemia secondary to blood loss (chronic): Secondary | ICD-10-CM | POA: Diagnosis not present

## 2023-05-29 DIAGNOSIS — Z66 Do not resuscitate: Secondary | ICD-10-CM | POA: Diagnosis not present

## 2023-05-29 DIAGNOSIS — E669 Obesity, unspecified: Secondary | ICD-10-CM | POA: Diagnosis not present

## 2023-05-29 DIAGNOSIS — E871 Hypo-osmolality and hyponatremia: Secondary | ICD-10-CM | POA: Diagnosis not present

## 2023-05-29 DIAGNOSIS — A403 Sepsis due to Streptococcus pneumoniae: Secondary | ICD-10-CM | POA: Diagnosis not present

## 2023-05-29 DIAGNOSIS — Z6832 Body mass index (BMI) 32.0-32.9, adult: Secondary | ICD-10-CM | POA: Diagnosis not present

## 2023-05-29 DIAGNOSIS — I480 Paroxysmal atrial fibrillation: Secondary | ICD-10-CM | POA: Diagnosis not present

## 2023-05-31 ENCOUNTER — Inpatient Hospital Stay: Admission: RE | Admit: 2023-05-31 | Payer: PPO | Source: Ambulatory Visit

## 2023-06-03 DIAGNOSIS — I361 Nonrheumatic tricuspid (valve) insufficiency: Secondary | ICD-10-CM

## 2023-06-03 DIAGNOSIS — I340 Nonrheumatic mitral (valve) insufficiency: Secondary | ICD-10-CM

## 2023-06-03 DIAGNOSIS — I351 Nonrheumatic aortic (valve) insufficiency: Secondary | ICD-10-CM

## 2023-06-10 DIAGNOSIS — J189 Pneumonia, unspecified organism: Secondary | ICD-10-CM | POA: Diagnosis not present

## 2023-06-10 DIAGNOSIS — Z7401 Bed confinement status: Secondary | ICD-10-CM | POA: Diagnosis not present

## 2023-06-10 DIAGNOSIS — R2681 Unsteadiness on feet: Secondary | ICD-10-CM | POA: Diagnosis not present

## 2023-06-10 DIAGNOSIS — F419 Anxiety disorder, unspecified: Secondary | ICD-10-CM | POA: Diagnosis not present

## 2023-06-10 DIAGNOSIS — J13 Pneumonia due to Streptococcus pneumoniae: Secondary | ICD-10-CM | POA: Diagnosis not present

## 2023-06-10 DIAGNOSIS — L409 Psoriasis, unspecified: Secondary | ICD-10-CM | POA: Diagnosis not present

## 2023-06-10 DIAGNOSIS — R262 Difficulty in walking, not elsewhere classified: Secondary | ICD-10-CM | POA: Diagnosis not present

## 2023-06-10 DIAGNOSIS — D649 Anemia, unspecified: Secondary | ICD-10-CM | POA: Diagnosis not present

## 2023-06-10 DIAGNOSIS — N950 Postmenopausal bleeding: Secondary | ICD-10-CM | POA: Diagnosis not present

## 2023-06-10 DIAGNOSIS — R2689 Other abnormalities of gait and mobility: Secondary | ICD-10-CM | POA: Diagnosis not present

## 2023-06-10 DIAGNOSIS — E039 Hypothyroidism, unspecified: Secondary | ICD-10-CM | POA: Diagnosis not present

## 2023-06-10 DIAGNOSIS — K219 Gastro-esophageal reflux disease without esophagitis: Secondary | ICD-10-CM | POA: Diagnosis not present

## 2023-06-10 DIAGNOSIS — I119 Hypertensive heart disease without heart failure: Secondary | ICD-10-CM | POA: Diagnosis not present

## 2023-06-10 DIAGNOSIS — E871 Hypo-osmolality and hyponatremia: Secondary | ICD-10-CM | POA: Diagnosis not present

## 2023-06-10 DIAGNOSIS — I480 Paroxysmal atrial fibrillation: Secondary | ICD-10-CM | POA: Diagnosis not present

## 2023-06-10 DIAGNOSIS — I10 Essential (primary) hypertension: Secondary | ICD-10-CM | POA: Diagnosis not present

## 2023-06-10 DIAGNOSIS — E785 Hyperlipidemia, unspecified: Secondary | ICD-10-CM | POA: Diagnosis not present

## 2023-06-10 DIAGNOSIS — R531 Weakness: Secondary | ICD-10-CM | POA: Diagnosis not present

## 2023-06-10 DIAGNOSIS — A403 Sepsis due to Streptococcus pneumoniae: Secondary | ICD-10-CM | POA: Diagnosis not present

## 2023-06-10 DIAGNOSIS — G4733 Obstructive sleep apnea (adult) (pediatric): Secondary | ICD-10-CM | POA: Diagnosis not present

## 2023-06-10 DIAGNOSIS — M810 Age-related osteoporosis without current pathological fracture: Secondary | ICD-10-CM | POA: Diagnosis not present

## 2023-06-10 DIAGNOSIS — J9601 Acute respiratory failure with hypoxia: Secondary | ICD-10-CM | POA: Diagnosis not present

## 2023-06-11 DIAGNOSIS — R262 Difficulty in walking, not elsewhere classified: Secondary | ICD-10-CM | POA: Diagnosis not present

## 2023-06-11 DIAGNOSIS — J189 Pneumonia, unspecified organism: Secondary | ICD-10-CM | POA: Diagnosis not present

## 2023-06-11 DIAGNOSIS — I119 Hypertensive heart disease without heart failure: Secondary | ICD-10-CM | POA: Diagnosis not present

## 2023-06-11 DIAGNOSIS — D649 Anemia, unspecified: Secondary | ICD-10-CM | POA: Diagnosis not present

## 2023-06-17 DIAGNOSIS — E669 Obesity, unspecified: Secondary | ICD-10-CM | POA: Diagnosis not present

## 2023-06-17 DIAGNOSIS — Z6830 Body mass index (BMI) 30.0-30.9, adult: Secondary | ICD-10-CM | POA: Diagnosis not present

## 2023-06-17 DIAGNOSIS — Z9181 History of falling: Secondary | ICD-10-CM | POA: Diagnosis not present

## 2023-06-17 DIAGNOSIS — D649 Anemia, unspecified: Secondary | ICD-10-CM | POA: Diagnosis not present

## 2023-06-17 DIAGNOSIS — K219 Gastro-esophageal reflux disease without esophagitis: Secondary | ICD-10-CM | POA: Diagnosis not present

## 2023-06-17 DIAGNOSIS — Z8701 Personal history of pneumonia (recurrent): Secondary | ICD-10-CM | POA: Diagnosis not present

## 2023-06-17 DIAGNOSIS — G4730 Sleep apnea, unspecified: Secondary | ICD-10-CM | POA: Diagnosis not present

## 2023-06-17 DIAGNOSIS — Z79899 Other long term (current) drug therapy: Secondary | ICD-10-CM | POA: Diagnosis not present

## 2023-06-17 DIAGNOSIS — I480 Paroxysmal atrial fibrillation: Secondary | ICD-10-CM | POA: Diagnosis not present

## 2023-06-17 DIAGNOSIS — F419 Anxiety disorder, unspecified: Secondary | ICD-10-CM | POA: Diagnosis not present

## 2023-06-17 DIAGNOSIS — J9601 Acute respiratory failure with hypoxia: Secondary | ICD-10-CM | POA: Diagnosis not present

## 2023-06-17 DIAGNOSIS — Z96653 Presence of artificial knee joint, bilateral: Secondary | ICD-10-CM | POA: Diagnosis not present

## 2023-06-17 DIAGNOSIS — Z8616 Personal history of COVID-19: Secondary | ICD-10-CM | POA: Diagnosis not present

## 2023-06-17 DIAGNOSIS — E039 Hypothyroidism, unspecified: Secondary | ICD-10-CM | POA: Diagnosis not present

## 2023-06-17 DIAGNOSIS — I10 Essential (primary) hypertension: Secondary | ICD-10-CM | POA: Diagnosis not present

## 2023-06-17 DIAGNOSIS — M810 Age-related osteoporosis without current pathological fracture: Secondary | ICD-10-CM | POA: Diagnosis not present

## 2023-06-17 DIAGNOSIS — E785 Hyperlipidemia, unspecified: Secondary | ICD-10-CM | POA: Diagnosis not present

## 2023-06-17 DIAGNOSIS — L4050 Arthropathic psoriasis, unspecified: Secondary | ICD-10-CM | POA: Diagnosis not present

## 2023-06-20 DIAGNOSIS — K449 Diaphragmatic hernia without obstruction or gangrene: Secondary | ICD-10-CM | POA: Diagnosis not present

## 2023-06-20 DIAGNOSIS — I480 Paroxysmal atrial fibrillation: Secondary | ICD-10-CM | POA: Diagnosis not present

## 2023-06-20 DIAGNOSIS — J9611 Chronic respiratory failure with hypoxia: Secondary | ICD-10-CM | POA: Insufficient documentation

## 2023-06-20 DIAGNOSIS — G4733 Obstructive sleep apnea (adult) (pediatric): Secondary | ICD-10-CM | POA: Diagnosis not present

## 2023-06-20 DIAGNOSIS — R82998 Other abnormal findings in urine: Secondary | ICD-10-CM | POA: Diagnosis not present

## 2023-06-20 DIAGNOSIS — I10 Essential (primary) hypertension: Secondary | ICD-10-CM | POA: Diagnosis not present

## 2023-06-20 DIAGNOSIS — M069 Rheumatoid arthritis, unspecified: Secondary | ICD-10-CM | POA: Diagnosis not present

## 2023-06-20 HISTORY — DX: Chronic respiratory failure with hypoxia: J96.11

## 2023-06-20 HISTORY — DX: Paroxysmal atrial fibrillation: I48.0

## 2023-06-23 DIAGNOSIS — H43813 Vitreous degeneration, bilateral: Secondary | ICD-10-CM | POA: Diagnosis not present

## 2023-06-23 DIAGNOSIS — H353231 Exudative age-related macular degeneration, bilateral, with active choroidal neovascularization: Secondary | ICD-10-CM | POA: Diagnosis not present

## 2023-06-23 DIAGNOSIS — H35033 Hypertensive retinopathy, bilateral: Secondary | ICD-10-CM | POA: Diagnosis not present

## 2023-06-23 DIAGNOSIS — Z961 Presence of intraocular lens: Secondary | ICD-10-CM | POA: Diagnosis not present

## 2023-06-26 DIAGNOSIS — J9601 Acute respiratory failure with hypoxia: Secondary | ICD-10-CM | POA: Diagnosis not present

## 2023-06-26 DIAGNOSIS — F419 Anxiety disorder, unspecified: Secondary | ICD-10-CM | POA: Diagnosis not present

## 2023-06-26 DIAGNOSIS — D649 Anemia, unspecified: Secondary | ICD-10-CM | POA: Diagnosis not present

## 2023-06-26 DIAGNOSIS — I4891 Unspecified atrial fibrillation: Secondary | ICD-10-CM | POA: Diagnosis not present

## 2023-06-26 DIAGNOSIS — R079 Chest pain, unspecified: Secondary | ICD-10-CM | POA: Diagnosis not present

## 2023-06-26 DIAGNOSIS — I10 Essential (primary) hypertension: Secondary | ICD-10-CM | POA: Diagnosis not present

## 2023-06-26 DIAGNOSIS — L4050 Arthropathic psoriasis, unspecified: Secondary | ICD-10-CM | POA: Diagnosis not present

## 2023-06-26 DIAGNOSIS — E669 Obesity, unspecified: Secondary | ICD-10-CM | POA: Diagnosis not present

## 2023-06-26 DIAGNOSIS — E785 Hyperlipidemia, unspecified: Secondary | ICD-10-CM | POA: Diagnosis not present

## 2023-06-26 DIAGNOSIS — K219 Gastro-esophageal reflux disease without esophagitis: Secondary | ICD-10-CM | POA: Diagnosis not present

## 2023-06-26 DIAGNOSIS — G4730 Sleep apnea, unspecified: Secondary | ICD-10-CM | POA: Diagnosis not present

## 2023-06-26 DIAGNOSIS — I509 Heart failure, unspecified: Secondary | ICD-10-CM | POA: Diagnosis not present

## 2023-06-26 DIAGNOSIS — M810 Age-related osteoporosis without current pathological fracture: Secondary | ICD-10-CM | POA: Diagnosis not present

## 2023-06-26 DIAGNOSIS — E039 Hypothyroidism, unspecified: Secondary | ICD-10-CM | POA: Diagnosis not present

## 2023-06-26 DIAGNOSIS — R918 Other nonspecific abnormal finding of lung field: Secondary | ICD-10-CM | POA: Diagnosis not present

## 2023-06-26 DIAGNOSIS — I480 Paroxysmal atrial fibrillation: Secondary | ICD-10-CM | POA: Diagnosis not present

## 2023-06-28 ENCOUNTER — Inpatient Hospital Stay: Admission: RE | Admit: 2023-06-28 | Payer: PPO | Source: Ambulatory Visit

## 2023-07-07 ENCOUNTER — Encounter (HOSPITAL_BASED_OUTPATIENT_CLINIC_OR_DEPARTMENT_OTHER): Payer: Self-pay | Admitting: Emergency Medicine

## 2023-07-07 ENCOUNTER — Ambulatory Visit (HOSPITAL_BASED_OUTPATIENT_CLINIC_OR_DEPARTMENT_OTHER)
Admission: EM | Admit: 2023-07-07 | Discharge: 2023-07-07 | Disposition: A | Discharge status: Home / Self Care | Payer: PPO | Attending: Family Medicine | Admitting: Family Medicine

## 2023-07-07 DIAGNOSIS — L409 Psoriasis, unspecified: Secondary | ICD-10-CM

## 2023-07-07 DIAGNOSIS — D485 Neoplasm of uncertain behavior of skin: Secondary | ICD-10-CM | POA: Insufficient documentation

## 2023-07-07 DIAGNOSIS — L814 Other melanin hyperpigmentation: Secondary | ICD-10-CM | POA: Insufficient documentation

## 2023-07-07 DIAGNOSIS — D2271 Melanocytic nevi of right lower limb, including hip: Secondary | ICD-10-CM | POA: Insufficient documentation

## 2023-07-07 DIAGNOSIS — Z872 Personal history of diseases of the skin and subcutaneous tissue: Secondary | ICD-10-CM | POA: Insufficient documentation

## 2023-07-07 DIAGNOSIS — L820 Inflamed seborrheic keratosis: Secondary | ICD-10-CM | POA: Insufficient documentation

## 2023-07-07 DIAGNOSIS — L821 Other seborrheic keratosis: Secondary | ICD-10-CM

## 2023-07-07 DIAGNOSIS — L720 Epidermal cyst: Secondary | ICD-10-CM

## 2023-07-07 DIAGNOSIS — C44612 Basal cell carcinoma of skin of right upper limb, including shoulder: Secondary | ICD-10-CM

## 2023-07-07 DIAGNOSIS — L578 Other skin changes due to chronic exposure to nonionizing radiation: Secondary | ICD-10-CM | POA: Insufficient documentation

## 2023-07-07 DIAGNOSIS — D1801 Hemangioma of skin and subcutaneous tissue: Secondary | ICD-10-CM | POA: Insufficient documentation

## 2023-07-07 DIAGNOSIS — M7989 Other specified soft tissue disorders: Secondary | ICD-10-CM

## 2023-07-07 DIAGNOSIS — M79642 Pain in left hand: Secondary | ICD-10-CM

## 2023-07-07 HISTORY — DX: Epidermal cyst: L72.0

## 2023-07-07 HISTORY — DX: Inflamed seborrheic keratosis: L82.0

## 2023-07-07 HISTORY — DX: Psoriasis, unspecified: L40.9

## 2023-07-07 HISTORY — DX: Neoplasm of uncertain behavior of skin: D48.5

## 2023-07-07 HISTORY — DX: Other skin changes due to chronic exposure to nonionizing radiation: L57.8

## 2023-07-07 HISTORY — DX: Hemangioma of skin and subcutaneous tissue: D18.01

## 2023-07-07 HISTORY — DX: Other melanin hyperpigmentation: L81.4

## 2023-07-07 HISTORY — DX: Other seborrheic keratosis: L82.1

## 2023-07-07 HISTORY — DX: Personal history of diseases of the skin and subcutaneous tissue: Z87.2

## 2023-07-07 HISTORY — DX: Basal cell carcinoma of skin of right upper limb, including shoulder: C44.612

## 2023-07-07 HISTORY — DX: Melanocytic nevi of right lower limb, including hip: D22.71

## 2023-07-07 MED ORDER — PREDNISONE 20 MG PO TABS: 40.0000 mg | ORAL_TABLET | Freq: Every day | ORAL | 0 refills | Status: DC

## 2023-07-07 NOTE — ED Triage Notes (Signed)
 Pt c/o left hand swelling for a couple days. Denies injury. Reports tried Ibuprofen, ice, Biofreeze with out relief.

## 2023-07-07 NOTE — ED Provider Notes (Signed)
 [facility]   [id number] 07/07/23 Arrival Time: 1154  ASSESSMENT & PLAN:  1. Left hand pain   2. Swelling of left hand    Non-traumatic. Likely inflammatory leading to swelling. Discussed.  Trial of: New Prescriptions   PREDNISONE (DELTASONE) 20 MG TABLET    Take 2 tablets (40 mg total) by mouth daily.   Recommend:  Follow-up Information     Gina, Gina Draft, MD.   Specialty: Internal Medicine Why: As needed. Contact information: [address] [place] [address] [phone]         [facility].   Specialty: Urgent Care Why: If worsening or failing to improve as anticipated. Contact information: [address], [address] [place] [address] [phone]               Reviewed expectations re: course of current medical issues. Questions answered. Outlined signs and symptoms indicating need for more acute intervention. Patient verbalized understanding. After Visit Summary given.  SUBJECTIVE: History from: patient. Gina Waller is a 89 y.o. female who reports L hand swelling; with pain; initially noted pain in wrist area before swelling. Ibuprofen with mild help. No extremity sensation changes or weakness.  No h/o similar.  Past Surgical History:  Procedure Laterality Date   BREAST REDUCTION SURGERY     At age 77   JOINT REPLACEMENT     TONSILLECTOMY     TOTAL KNEE ARTHROPLASTY     TOTAL KNEE ARTHROPLASTY Right 03/09/2020   Procedure: TOTAL KNEE ARTHROPLASTY;  Surgeon: Dannielle Huh, MD;  Location: [facility];  Service: Orthopedics;  Laterality: Right;     OBJECTIVE:  Vitals:   07/07/23 1259  BP: 117/75  Pulse: 75  Resp: 18  Temp: 98.2 F (36.8 C)  TempSrc: Oral  SpO2: 92%    General appearance: alert; no distress HEENT: [place]; AT Neck: supple with FROM Resp: unlabored respirations Extremities: LUE: warm with well perfused appearance; with L hand swelling and 1+ pitting edema;  TTP; wrist with FROM; normal capillary refill and distal sensation of all fingers; no signs of skin infection/cellulitis Psychological: alert and cooperative; normal mood and affect    Allergies  Allergen Reactions   Ace Inhibitors     Other Reaction(s): cough   Amlodipine     Other Reaction(s): Unknown   Cephalexin     Other Reaction(s): dizziness   Etodolac     stiffened arms   Lemonade Flavor [Flavoring Agent (Non-Screening)]     sneezing    Past Medical History:  Diagnosis Date   Adenomatous colon polyp    Basal cell carcinoma of chest 2021   GERD (gastroesophageal reflux disease)    Hemorrhoids    High cholesterol    Hypertension    Hypothyroidism    Osteoarthritis    Pneumonia    as a child   Sleep apnea    Social History   Socioeconomic History   Marital status: Widowed    Spouse name: Not on file   Number of children: Not on file   Years of education: Not on file   Highest education level: Not on file  Occupational History   Not on file  Tobacco Use   Smoking status: Never   Smokeless tobacco: Never  Vaping Use   Vaping status: Never Used  Substance and Sexual Activity   Alcohol use: No   Drug use: No   Sexual activity: Not on file  Other Topics Concern   Not  on file  Social History Narrative   Not on file   Social Drivers of Health   Financial Resource Strain: Not on file  Food Insecurity: Not on file  Transportation Needs: Not on file  Physical Activity: Not on file  Stress: Not on file  Social Connections: Not on file   Family History  Problem Relation Age of Onset   Colon cancer Father    Past Surgical History:  Procedure Laterality Date   BREAST REDUCTION SURGERY     At age 73   JOINT REPLACEMENT     TONSILLECTOMY     TOTAL KNEE ARTHROPLASTY     TOTAL KNEE ARTHROPLASTY Right 03/09/2020   Procedure: TOTAL KNEE ARTHROPLASTY;  Surgeon: Dannielle Huh, MD;  Location: [facility];  Service: Orthopedics;  Laterality: Right;        Gina Layman, MD 07/07/23 1326

## 2023-07-12 ENCOUNTER — Inpatient Hospital Stay: Admission: RE | Admit: 2023-07-12 | Payer: PPO | Source: Ambulatory Visit

## 2023-07-14 DIAGNOSIS — G4733 Obstructive sleep apnea (adult) (pediatric): Secondary | ICD-10-CM | POA: Diagnosis not present

## 2023-07-14 DIAGNOSIS — R0600 Dyspnea, unspecified: Secondary | ICD-10-CM | POA: Diagnosis not present

## 2023-07-24 ENCOUNTER — Ambulatory Visit
Admission: RE | Admit: 2023-07-24 | Discharge: 2023-07-24 | Disposition: A | Discharge status: Home / Self Care | Source: Ambulatory Visit | Attending: Obstetrics and Gynecology | Admitting: Obstetrics and Gynecology

## 2023-07-24 DIAGNOSIS — K5730 Diverticulosis of large intestine without perforation or abscess without bleeding: Secondary | ICD-10-CM | POA: Diagnosis not present

## 2023-07-24 DIAGNOSIS — N83291 Other ovarian cyst, right side: Secondary | ICD-10-CM | POA: Diagnosis not present

## 2023-07-24 DIAGNOSIS — R1909 Other intra-abdominal and pelvic swelling, mass and lump: Secondary | ICD-10-CM

## 2023-07-24 MED ORDER — GADOPICLENOL 0.5 MMOL/ML IV SOLN
8.0000 mL | Freq: Once | INTRAVENOUS | Status: AC | PRN
  Administered 2023-07-24: 8 mL via INTRAVENOUS

## 2023-07-31 DIAGNOSIS — G4733 Obstructive sleep apnea (adult) (pediatric): Secondary | ICD-10-CM | POA: Diagnosis not present

## 2023-08-08 ENCOUNTER — Telehealth: Payer: Self-pay

## 2023-08-08 DIAGNOSIS — E039 Hypothyroidism, unspecified: Secondary | ICD-10-CM | POA: Diagnosis not present

## 2023-08-08 DIAGNOSIS — I10 Essential (primary) hypertension: Secondary | ICD-10-CM | POA: Diagnosis not present

## 2023-08-08 DIAGNOSIS — M069 Rheumatoid arthritis, unspecified: Secondary | ICD-10-CM | POA: Diagnosis not present

## 2023-08-08 DIAGNOSIS — J9611 Chronic respiratory failure with hypoxia: Secondary | ICD-10-CM | POA: Diagnosis not present

## 2023-08-08 DIAGNOSIS — I480 Paroxysmal atrial fibrillation: Secondary | ICD-10-CM | POA: Diagnosis not present

## 2023-08-08 DIAGNOSIS — K449 Diaphragmatic hernia without obstruction or gangrene: Secondary | ICD-10-CM | POA: Diagnosis not present

## 2023-08-08 DIAGNOSIS — G4733 Obstructive sleep apnea (adult) (pediatric): Secondary | ICD-10-CM | POA: Diagnosis not present

## 2023-08-08 DIAGNOSIS — L409 Psoriasis, unspecified: Secondary | ICD-10-CM | POA: Diagnosis not present

## 2023-08-08 DIAGNOSIS — I872 Venous insufficiency (chronic) (peripheral): Secondary | ICD-10-CM | POA: Diagnosis not present

## 2023-08-08 DIAGNOSIS — E669 Obesity, unspecified: Secondary | ICD-10-CM | POA: Diagnosis not present

## 2023-08-08 DIAGNOSIS — N838 Other noninflammatory disorders of ovary, fallopian tube and broad ligament: Secondary | ICD-10-CM | POA: Diagnosis not present

## 2023-08-08 DIAGNOSIS — F419 Anxiety disorder, unspecified: Secondary | ICD-10-CM | POA: Diagnosis not present

## 2023-08-08 NOTE — Telephone Encounter (Signed)
 Spoke with Hosie Spangle, pt's niece,  regarding her referral to GYN oncology. She has an appointment scheduled with Dr. Pricilla Holm on 08/18/23 at 11:15. Patient agrees to date and time. She has been provided with office address and location. She is also aware of our mask and visitor policy. Patient verbalized understanding and will call with any questions.

## 2023-08-16 NOTE — Progress Notes (Unsigned)
  CA125 on 04/29/2023: 33.4 Endometrial biopsy on 04/28/2023: Fragments of benign endometrium, no hyperplasia, atypia, or malignancy. Patient initially seen on 12/20 for postmenopausal bleeding and right ovarian mass.  Pelvic ultrasound showed 4.8 cm solid right adnexal mass without ascites, CT suggestive of a pedunculated fibroid.  Patient endorsed vaginal spotting at that time.  Has a history of a benign polyp removed by Dr. Ambrose Mantle 12 years previously. Father with colon cancer Last mammo 12/2021, last colonoscopy 03/2014, no history of abnormal Pap smears G1 P0

## 2023-08-16 NOTE — H&P (View-Only) (Signed)
 GYNECOLOGIC ONCOLOGY NEW PATIENT CONSULTATION   Patient Name: Gina Waller  Patient Age: 89 y.o. Date of Service: 08/18/23 Referring Provider: Belle Box, MD  Primary Care Provider: Lonzie Robins, MD Consulting Provider: Wiley Hanger, MD   Assessment/Plan:  Postmenopausal patient with complex adnexal mass.  I spent some time reviewing with the patient and her nieces recent workup including imaging findings.  We looked at both the CT pictures as well as the MRI.  On CT, the character of the right sided adnexal mass is very similar in appearance to the uterus, suggesting a pedunculated fibroid.  On MRI, findings are more concerning for an adnexal mass.  Given features on MRI that raise the concern for possible borderline process or malignancy, we discussed option of moving forward with diagnostic surgery.  Despite her age, I feel that the patient is an acceptable surgical candidate.  We discussed her recent blood work which shows a normal CA125.  She also had an endometrial biopsy given her postmenopausal bleeding and thickened endometrium which showed benign endometrial tissue.  We discussed plan for robotic surgery.  If the adnexal mass is ovarian, plan will be to remove that tube and ovary and send it for frozen section.  If benign, we will proceed with removal of the contralateral tube and ovary.  We discussed the risks (increased surgical time, small increased risk with surgical morbidity) and benefits (ensuring no endometrial pathology, limiting need for additional procedures if she has more postmenopausal bleeding in the future).  Ultimately, she would like to proceed with concurrent total hysterectomy.  In the setting of a borderline tumor, we discussed additional staging procedures including peritoneal biopsies and omental biopsy.  If mass represents an ovarian malignancy, we discussed possible addition of lymphadenectomy.  This will be based on findings during surgery and risk  related to additional time under anesthesia.  If the mass appears to be attached to the uterus, then we will proceed with total hysterectomy and bilateral salpingo-oophorectomy.  We reviewed the plan for a robotic assisted hysterectomy, bilateral salpingo-oophorectomy, possible staging, possible laparotomy. The risks of surgery were discussed in detail and she understands these to include infection; wound separation; hernia; vaginal cuff separation, injury to adjacent organs such as bowel, bladder, blood vessels, ureters and nerves; bleeding which may require blood transfusion; anesthesia risk; thromboembolic events; possible death; unforeseen complications; possible need for re-exploration; medical complications such as heart attack, stroke, pleural effusion and pneumonia; and, if full lymphadenectomy is performed the risk of lymphedema and lymphocyst. The patient will receive DVT and antibiotic prophylaxis as indicated. She voiced a clear understanding. She had the opportunity to ask questions. Perioperative instructions were reviewed with her. Prescriptions for post-op medications were sent to her pharmacy of choice.  She has her next injections for macular degeneration on 5/2.  Her preference was to wait to have surgery until after that.  Given her recent pulmonary disease, will reach out to her primary care provider for clearance.  Will also discussed with anesthesia to ensure no testing required prior to surgery.  A copy of this note was sent to the patient's referring provider.   65 minutes of total time was spent for this patient encounter, including preparation, face-to-face counseling with the patient and coordination of care, and documentation of the encounter.  Wiley Hanger, MD  Division of Gynecologic Oncology  Department of Obstetrics and Gynecology  [facility]  ___________________________________________  Chief Complaint: Chief Complaint  Patient  presents with  Adnexal mass    History of Present Illness:  Gina Waller is a 89 y.o. y.o. female who is seen in consultation at the request of Dr. Asencion Blacksmith for an evaluation of a complex adnexal mass.  Pelvic ultrasound exam on 04/10/2023: Uterus measures 9.2 x 4.4 x 5.9 cm.  Nabothian cyst seen within the cervix.  Endometrium measures 9.2 mm.  Right ovary not visualized however there is a right adnexal mass measuring 4.8 x 4.7 x 3.7 cm.  Left ovary not visualized. CT of the pelvis on 04/19/2023: 5.2 cm rounded mass in the right adnexa along the right anterior margin of the uterus, isodense of the uterine parenchyma, most consistent with pedunculated subserosal uterine fibroid.  Persistent endometrial thickening, similar to recent ultrasound.  Hypodense masslike area within the cervix, could reflect nabothian cyst versus cervical mass.  Sigmoid diverticulosis noted.  No free fluid, no adenopathy. Patient initially seen on 12/20 for postmenopausal bleeding and right ovarian mass.  Pelvic ultrasound showed 4.8 cm solid right adnexal mass without ascites, CT suggestive of a pedunculated fibroid.  Patient endorsed vaginal spotting at that time.  Has a history of a benign polyp removed by Dr. Shelah Derry 12 years previously. Endometrial biopsy on 04/28/2023: Fragments of benign endometrium, no hyperplasia, atypia, or malignancy. CA125 on 04/29/2023: 33.4 MRI of the pelvis on 07/24/2023: Complex right adnexal lesion measuring 6.2 x 5.9 x 5.6 cm with thickened septations.  Thick enhancing margin and some internal nodularity along septations noted.  Appearance not typical for fibroid.  Thickened junctional zone within the uterus measuring 1.6 cm suggestive of adenomyosis.  Likely small, less than 1 cm fundal fibroid.  Sigmoid colon diverticulosis also noted.  No ascites.  No adenopathy.  Patient reports overall doing well.  She initially presented with postmenopausal bleeding which she describes as a spot on her  underwear intermittently (not daily) for approximately 2-3 weeks.  She denies any associated pain or cramping.  She underwent multiple imaging studies as well as an endometrial biopsy described above given bleeding and thickened endometrium.  She notes some decreased appetite due to medications that she was on for pruritus of her scalp.  She lost in total about 10 pounds and was down to 160 pounds.  She is now off of this medication and is back up to 165 pounds.  She denies any nausea or emesis.  Denies any abdominal or pelvic pain.  Reports baseline bowel function (some constipation).  Has noticed some increased urinary urgency.  Reports history of pneumonia treated at the beginning of this year and tells me that she was diagnosed with COPD subsequently.  She has recently been prescribed an albuterol inhaler, which she has not yet started to use.  She endorses some mild shortness of breath only with strenuous activity.  She endorses that sleep apnea was something that she was diagnosed with in the past, not currently symptomatic.  Does not use a CPAP.  She lives in an apartment by herself in a retirement community in [place].  She has an in-home helper.  Walks with a cane. Comes in with her nieces today.   PAST MEDICAL HISTORY:  Past Medical History:  Diagnosis Date   Adenomatous colon polyp    Basal cell carcinoma of chest 2021   GERD (gastroesophageal reflux disease)    Hemorrhoids    High cholesterol    Hypertension    Hypothyroidism    Osteoarthritis    Pneumonia    as a child   Pneumonia  2025   Sleep apnea    remote diagnosis, no CPAP     PAST SURGICAL HISTORY:  Past Surgical History:  Procedure Laterality Date   BREAST REDUCTION SURGERY     At age 33   DILATION AND CURETTAGE OF UTERUS  2012   to remove uterine polyps   JOINT REPLACEMENT     TONSILLECTOMY     TOTAL KNEE ARTHROPLASTY     TOTAL KNEE ARTHROPLASTY Right 03/09/2020   Procedure: TOTAL KNEE ARTHROPLASTY;   Surgeon: Christie Cox, MD;  Location: [facility];  Service: Orthopedics;  Laterality: Right;    OB/GYN HISTORY:  OB History  Gravida Para Term Preterm AB Living  1 0      SAB IAB Ectopic Multiple Live Births          # Outcome Date GA Lbr Len/2nd Weight Sex Type Anes PTL Lv  1 Gravida             No LMP recorded. Patient is postmenopausal.  Age at menarche: 21  Age at menopause: 43 Hx of HRT: denies Hx of STDs: denies Last pap: unsure History of abnormal pap smears: denies  SCREENING STUDIES:  Last mammogram: 2023  Last colonoscopy: 2015  MEDICATIONS: Outpatient Encounter Medications as of 08/18/2023  Medication Sig   senna-docusate (SENOKOT-S) 8.6-50 MG tablet Take 2 tablets by mouth at bedtime. For AFTER surgery, do not take if having diarrhea   traMADol  (ULTRAM ) 50 MG tablet Take 1 tablet (50 mg total) by mouth every 6 (six) hours as needed for severe pain (pain score 7-10). For AFTER surgery, do not take and drive   amLODipine  (NORVASC ) 5 MG tablet Take 5 mg by mouth daily.     busPIRone (BUSPAR) 5 MG tablet Take 5 mg by mouth daily.   Calcium  Carbonate-Vit D-Min (CALCIUM  1200) 1200-1000 MG-UNIT CHEW Chew 1 tablet by mouth 2 (two) times daily.   cholecalciferol (VITAMIN D) 1000 UNITS tablet Take 1,000 Units by mouth daily.     furosemide (LASIX) 40 MG tablet Take 40 mg by mouth daily.   levothyroxine  (SYNTHROID , LEVOTHROID) 50 MCG tablet Take 50 mcg by mouth daily.     melatonin 5 MG TABS Take 2.5 mg by mouth at bedtime as needed (sleep).   metoprolol  succinate (TOPROL -XL) 50 MG 24 hr tablet Take 50 mg by mouth daily. Take with or immediately following a meal.   Multiple Vitamins-Minerals (MULTIVITAMINS THER. W/MINERALS) TABS Take 1 tablet by mouth daily.     NON FORMULARY CPAP, USE NIGHTLY FOR SLEEP APNEA   Omega-3 Fatty Acids (FISH OIL TRIPLE STRENGTH) 1400 MG CAPS Take 1,400 mg by mouth every other day.   pantoprazole  (PROTONIX ) 40 MG tablet Take 40 mg by mouth daily.      potassium chloride  (KLOR-CON ) 10 MEQ tablet Take 10 mEq by mouth daily.   predniSONE  (DELTASONE ) 20 MG tablet Take 2 tablets (40 mg total) by mouth daily.   rosuvastatin  (CRESTOR ) 20 MG tablet Take 20 mg by mouth daily.     telmisartan -hydrochlorothiazide  (MICARDIS  HCT) 80-12.5 MG per tablet Take 1 tablet by mouth daily.     No facility-administered encounter medications on file as of 08/18/2023.    ALLERGIES:  Allergies  Allergen Reactions   Ace Inhibitors     Other Reaction(s): cough   Amlodipine      Other Reaction(s): Unknown   Cephalexin     Other Reaction(s): dizziness   Etodolac     stiffened arms   Architectural technologist Agent (  Non-Screening)]     sneezing     FAMILY HISTORY:  Family History  Problem Relation Age of Onset   Colon cancer Father    Cancer Sister        colon?   Breast cancer Niece      SOCIAL HISTORY:  Social Connections: Not on file    REVIEW OF SYSTEMS:  + Hearing loss, cough, shortness of breath, leg swelling, constipation, joint pain, anxiety Denies appetite changes, fevers, chills, fatigue, unexplained weight changes. Denies neck lumps or masses, mouth sores, ringing in ears or voice changes. Denies wheezing.   Denies chest pain or palpitations.  Denies abdominal distention, pain, blood in stools, diarrhea, nausea, vomiting, or early satiety. Denies pain with intercourse, dysuria, frequency, hematuria or incontinence. Denies hot flashes, pelvic pain, vaginal bleeding or vaginal discharge.   Denies back pain or muscle pain/cramps. Denies itching, rash, or wounds. Denies dizziness, headaches, numbness or seizures. Denies swollen lymph nodes or glands, denies easy bruising or bleeding. Denies depression, confusion, or decreased concentration.  Physical Exam:  Vital Signs for this encounter:  Blood pressure 138/81, pulse 62, temperature 97.7 F (36.5 C), temperature source Oral, resp. rate 19, height 5\' 3"  (1.6 m), weight 167 lb 9.6  oz (76 kg), SpO2 94%. Body mass index is 29.69 kg/m. General: Alert, oriented, no acute distress.  HEENT: Normocephalic, atraumatic. Sclera anicteric.  Chest: Clear to auscultation bilaterally. No wheezes, rhonchi, or rales. Cardiovascular: Regular rate and rhythm, no murmurs, rubs, or gallops.  Abdomen: Normoactive bowel sounds. Soft, nondistended, nontender to palpation.  Some laxity of the anterior abdominal wall.  No masses or hepatosplenomegaly appreciated. No palpable fluid wave.  Extremities: Grossly normal range of motion. Warm, well perfused. No edema bilaterally.  Skin: No rashes or lesions.  Lymphatics: No cervical, supraclavicular, or inguinal adenopathy.  GU:  Normal external female genitalia. No lesions. No discharge or bleeding.             Bladder/urethra:  No lesions or masses, well supported bladder             Vagina: Atrophic, no lesions.             Cervix: Normal appearing, no lesions.             Uterus: Small, mobile, no parametrial involvement or nodularity.             Adnexa: Mass appreciated, better with abdominal hand within the right pelvis that moves in conjunction with the uterus, difficult to appreciate if this is attached or separate from the uterus, smooth.  No nodularity appreciated.  Rectal: Deferred.  LABORATORY AND RADIOLOGIC DATA:  Outside medical records were reviewed to synthesize the above history, along with the history and physical obtained during the visit.   Lab Results  Component Value Date   WBC 12.1 (H) 03/10/2020   HGB 11.7 (L) 03/10/2020   HCT 35.5 (L) 03/10/2020   PLT 267 03/10/2020   GLUCOSE 128 (H) 03/10/2020   ALT 15 03/02/2020   AST 19 03/02/2020   NA 136 03/10/2020   K 3.3 (L) 03/10/2020   CL 101 03/10/2020   CREATININE 0.74 03/10/2020   BUN 14 03/10/2020   CO2 23 03/10/2020   INR 1.02 07/04/2009

## 2023-08-17 ENCOUNTER — Encounter: Payer: Self-pay | Admitting: Gynecologic Oncology

## 2023-08-18 ENCOUNTER — Other Ambulatory Visit: Payer: Self-pay

## 2023-08-18 ENCOUNTER — Telehealth: Payer: Self-pay | Admitting: Oncology

## 2023-08-18 ENCOUNTER — Telehealth: Payer: Self-pay | Admitting: *Deleted

## 2023-08-18 ENCOUNTER — Inpatient Hospital Stay: Attending: Gynecologic Oncology | Admitting: Gynecologic Oncology

## 2023-08-18 ENCOUNTER — Encounter: Payer: Self-pay | Admitting: Gynecologic Oncology

## 2023-08-18 ENCOUNTER — Inpatient Hospital Stay: Admitting: Gynecologic Oncology

## 2023-08-18 VITALS — BP 138/81 | HR 62 | Temp 97.7°F | Resp 19 | Ht 63.0 in | Wt 167.6 lb

## 2023-08-18 DIAGNOSIS — J449 Chronic obstructive pulmonary disease, unspecified: Secondary | ICD-10-CM | POA: Insufficient documentation

## 2023-08-18 DIAGNOSIS — N9489 Other specified conditions associated with female genital organs and menstrual cycle: Secondary | ICD-10-CM

## 2023-08-18 DIAGNOSIS — Z8701 Personal history of pneumonia (recurrent): Secondary | ICD-10-CM | POA: Diagnosis not present

## 2023-08-18 DIAGNOSIS — D398 Neoplasm of uncertain behavior of other specified female genital organs: Secondary | ICD-10-CM | POA: Insufficient documentation

## 2023-08-18 DIAGNOSIS — E039 Hypothyroidism, unspecified: Secondary | ICD-10-CM | POA: Diagnosis not present

## 2023-08-18 DIAGNOSIS — Z79899 Other long term (current) drug therapy: Secondary | ICD-10-CM | POA: Diagnosis not present

## 2023-08-18 DIAGNOSIS — I10 Essential (primary) hypertension: Secondary | ICD-10-CM | POA: Insufficient documentation

## 2023-08-18 DIAGNOSIS — M1990 Unspecified osteoarthritis, unspecified site: Secondary | ICD-10-CM | POA: Insufficient documentation

## 2023-08-18 DIAGNOSIS — N950 Postmenopausal bleeding: Secondary | ICD-10-CM | POA: Insufficient documentation

## 2023-08-18 DIAGNOSIS — R9389 Abnormal findings on diagnostic imaging of other specified body structures: Secondary | ICD-10-CM

## 2023-08-18 DIAGNOSIS — K219 Gastro-esophageal reflux disease without esophagitis: Secondary | ICD-10-CM | POA: Insufficient documentation

## 2023-08-18 DIAGNOSIS — Z85828 Personal history of other malignant neoplasm of skin: Secondary | ICD-10-CM | POA: Diagnosis not present

## 2023-08-18 DIAGNOSIS — Z800 Family history of malignant neoplasm of digestive organs: Secondary | ICD-10-CM | POA: Diagnosis not present

## 2023-08-18 DIAGNOSIS — E7800 Pure hypercholesterolemia, unspecified: Secondary | ICD-10-CM | POA: Insufficient documentation

## 2023-08-18 DIAGNOSIS — Z803 Family history of malignant neoplasm of breast: Secondary | ICD-10-CM | POA: Insufficient documentation

## 2023-08-18 DIAGNOSIS — H3530 Unspecified macular degeneration: Secondary | ICD-10-CM | POA: Diagnosis not present

## 2023-08-18 MED ORDER — TRAMADOL HCL 50 MG PO TABS: 50.0000 mg | ORAL_TABLET | Freq: Four times a day (QID) | ORAL | 0 refills | Status: DC | PRN

## 2023-08-18 MED ORDER — SENNOSIDES-DOCUSATE SODIUM 8.6-50 MG PO TABS: 2.0000 | ORAL_TABLET | Freq: Every day | ORAL | 0 refills | Status: DC

## 2023-08-18 NOTE — Progress Notes (Signed)
 Patient here for a consult with Dr. Pricilla Holm and for a pre-operative appointment prior to her scheduled surgery on 09/13/2023. She is scheduled for a robotic assisted total laparoscopic hysterectomy, bilateral salpingo-oophorectomy, possible staging if a precancer or cancer is seen. The surgery was discussed in detail.  See after visit summary for additional details.    Discussed post-op pain management in detail including the aspects of the enhanced recovery pathway.  Advised her that a new prescription would be sent in for Tramadol and it is only to be used for after her upcoming surgery.  We discussed the use of tylenol post-op and to monitor for a maximum of 4,000 mg in a 24 hour period.  Also prescribed sennakot to be used after surgery and to hold if having loose stools.  Discussed bowel regimen in detail.     Discussed the use of SCDs and measures to take at home to prevent DVT including frequent mobility.  Reportable signs and symptoms of DVT discussed. Post-operative instructions discussed and expectations for after surgery. Incisional care discussed as well including reportable signs and symptoms including erythema, drainage, wound separation.     30 minutes spent with the patient.  Verbalizing understanding of material discussed. No needs or concerns voiced at the end of the visit.   Advised patient to call for any needs.  Advised that her post-operative medications had been prescribed and could be picked up at any time.    This appointment is included in the global surgical bundle as pre-operative teaching and has no charge.

## 2023-08-18 NOTE — Telephone Encounter (Signed)
 Per provider fax records and surgical optimization form to the following offices   PCP Dr Felipa Eth [phone] [facility] [phone]

## 2023-08-18 NOTE — Patient Instructions (Signed)
 Preparing for your Surgery  Plan for surgery on Sep 13, 2023 with Dr. Eugene Garnet at [facility]. You will be scheduled for robotic assisted total laparoscopic hysterectomy (removal of the uterus and cervix), bilateral salpingo-oophorectomy (removal of both ovaries and fallopian tubes), possible staging if a precancer or cancer is seen.   Pre-operative Testing -You will receive a phone call from presurgical testing at [facility] to arrange for a pre-operative appointment and lab work.  -Bring your insurance card, copy of an advanced directive if applicable, medication list  -At that visit, you will be asked to sign a consent for a possible blood transfusion in case a transfusion becomes necessary during surgery.  The need for a blood transfusion is rare but having consent is a necessary part of your care.     -You should not be taking blood thinners or aspirin at least ten days prior to surgery unless instructed by your surgeon.  -Do not take supplements such as fish oil (omega 3), red yeast rice, turmeric before your surgery. STOP TAKING AT LEAST 10 DAYS BEFORE SURGERY. You want to avoid medications with aspirin in them including headache powders such as BC or Goody's), Excedrin migraine.  Day Before Surgery at Home -You will be asked to take in a light diet the day before surgery. You will be advised you can have clear liquids up until 3 hours before your surgery.    Eat a light diet the day before surgery.  Examples including soups, broths, toast, yogurt, mashed potatoes.  AVOID GAS PRODUCING FOODS AND BEVERAGES. Things to avoid include carbonated beverages (fizzy beverages, sodas), raw fruits and raw vegetables (uncooked), or beans.   If your bowels are filled with gas, your surgeon will have difficulty visualizing your pelvic organs which increases your surgical risks.  Your role in recovery Your role is to become active as soon as directed by your doctor, while  still giving yourself time to heal.  Rest when you feel tired. You will be asked to do the following in order to speed your recovery:  - Cough and breathe deeply. This helps to clear and expand your lungs and can prevent pneumonia after surgery.  - STAY ACTIVE WHEN YOU GET HOME. Do mild physical activity. Walking or moving your legs help your circulation and body functions return to normal. Do not try to get up or walk alone the first time after surgery.   -If you develop swelling on one leg or the other, pain in the back of your leg, redness/warmth in one of your legs, please call the office or go to the Emergency Room to have a doppler to rule out a blood clot. For shortness of breath, chest pain-seek care in the Emergency Room as soon as possible. - Actively manage your pain. Managing your pain lets you move in comfort. We will ask you to rate your pain on a scale of zero to 10. It is your responsibility to tell your doctor or nurse where and how much you hurt so your pain can be treated.  Special Considerations -If you are diabetic, you may be placed on insulin after surgery to have closer control over your blood sugars to promote healing and recovery.  This does not mean that you will be discharged on insulin.  If applicable, your oral antidiabetics will be resumed when you are tolerating a solid diet.  -Your final pathology results from surgery should be available around one week after surgery and the  results will be relayed to you when available.  -FMLA forms can be faxed to [phone] and please allow 5-7 business days for completion.  Pain Management After Surgery -You will be prescribed your pain medication and bowel regimen medications before surgery so that you can have these available when you are discharged from the hospital. The pain medication is for use ONLY AFTER surgery and a new prescription will not be given.   -Make sure that you have Tylenol IF YOU ARE ABLE TO TAKE THESE  MEDICATION at home to use on a regular basis after surgery for pain control.   -Review the attached handout on narcotic use and their risks and side effects.   Bowel Regimen -You will be prescribed Sennakot-S to take nightly to prevent constipation especially if you are taking the narcotic pain medication intermittently.  It is important to prevent constipation and drink adequate amounts of liquids. You can stop taking this medication when you are not taking pain medication and you are back on your normal bowel routine.  Risks of Surgery Risks of surgery are low but include bleeding, infection, damage to surrounding structures, re-operation, blood clots, and very rarely death.   Blood Transfusion Information (For the consent to be signed before surgery)  We will be checking your blood type before surgery so in case of emergencies, we will know what type of blood you would need.                                            WHAT IS A BLOOD TRANSFUSION?  A transfusion is the replacement of blood or some of its parts. Blood is made up of multiple cells which provide different functions. Red blood cells carry oxygen and are used for blood loss replacement. White blood cells fight against infection. Platelets control bleeding. Plasma helps clot blood. Other blood products are available for specialized needs, such as hemophilia or other clotting disorders. BEFORE THE TRANSFUSION  Who gives blood for transfusions?  You may be able to donate blood to be used at a later date on yourself (autologous donation). Relatives can be asked to donate blood. This is generally not any safer than if you have received blood from a stranger. The same precautions are taken to ensure safety when a relative's blood is donated. Healthy volunteers who are fully evaluated to make sure their blood is safe. This is blood bank blood. Transfusion therapy is the safest it has ever been in the practice of medicine. Before  blood is taken from a donor, a complete history is taken to make sure that person has no history of diseases nor engages in risky social behavior (examples are intravenous drug use or sexual activity with multiple partners). The donor's travel history is screened to minimize risk of transmitting infections, such as malaria. The donated blood is tested for signs of infectious diseases, such as HIV and hepatitis. The blood is then tested to be sure it is compatible with you in order to minimize the chance of a transfusion reaction. If you or a relative donates blood, this is often done in anticipation of surgery and is not appropriate for emergency situations. It takes many days to process the donated blood. RISKS AND COMPLICATIONS Although transfusion therapy is very safe and saves many lives, the main dangers of transfusion include:  Getting an infectious disease. Developing a transfusion reaction.  This is an allergic reaction to something in the blood you were given. Every precaution is taken to prevent this. The decision to have a blood transfusion has been considered carefully by your caregiver before blood is given. Blood is not given unless the benefits outweigh the risks.  AFTER SURGERY INSTRUCTIONS  Return to work: 4-6 weeks if applicable  Activity: 1. Be up and out of the bed during the day.  Take a nap if needed.  You may walk up steps but be careful and use the hand rail.  Stair climbing will tire you more than you think, you may need to stop part way and rest.   2. No lifting or straining for 6 weeks over 10 pounds. No pushing, pulling, straining for 6 weeks.  3. No driving for 1-61 days when the following criteria have been met: Do not drive if you are taking narcotic pain medicine and make sure that your reaction time has returned.   4. You can shower as soon as the next day after surgery. Shower daily.  Use your regular soap and water (not directly on the incision) and pat your  incision(s) dry afterwards; don't rub.  No tub baths or submerging your body in water until cleared by your surgeon. If you have the soap that was given to you by pre-surgical testing that was used before surgery, you do not need to use it afterwards because this can irritate your incisions.   5. No sexual activity and nothing in the vagina for 10-12 weeks.  6. You may experience a small amount of clear drainage from your incisions, which is normal.  If the drainage persists, increases, or changes color please call the office.  7. Do not use creams, lotions, or ointments such as neosporin on your incisions after surgery until advised by your surgeon because they can cause removal of the dermabond glue on your incisions.    8. You may experience vaginal spotting after surgery or when the stitches at the top of the vagina begin to dissolve.  The spotting is normal but if you experience heavy bleeding, call our office.  9. Take Tylenol first for pain if you are able to take these medication and only use narcotic pain medication for severe pain not relieved by the Tylenol.  Monitor your Tylenol intake to a max of 4,000 mg in a 24 hour period.   Diet: 1. Low sodium Heart Healthy Diet is recommended but you are cleared to resume your normal (before surgery) diet after your procedure.  2. It is safe to use a laxative, such as Miralax or Colace, if you have difficulty moving your bowels before surgery. You have been prescribed Sennakot-S to take at bedtime every evening after surgery to keep bowel movements regular and to prevent constipation.    Wound Care: 1. Keep clean and dry.  Shower daily.  Reasons to call the Doctor: Fever - Oral temperature greater than 100.4 degrees Fahrenheit Foul-smelling vaginal discharge Difficulty urinating Nausea and vomiting Increased pain at the site of the incision that is unrelieved with pain medicine. Difficulty breathing with or without chest pain New calf  pain especially if only on one side Sudden, continuing increased vaginal bleeding with or without clots.   Contacts: For questions or concerns you should contact:  Dr. Eugene Garnet at [phone]  Warner Mccreedy, NP at [phone]  After Hours: call [phone] and have the GYN Oncologist paged/contacted (after 5 pm or on the weekends). You will speak with  an after hours RN and let he or she know you have had surgery.  Messages sent via mychart are for non-urgent matters and are not responded to after hours so for urgent needs, please call the after hours number.

## 2023-08-18 NOTE — Telephone Encounter (Signed)
 Called [facility] and spoke to [place]. She will cancel the Tramadol and senakot prescriptions sent this morning.  Patient requested for them to be sent to [facility].

## 2023-08-18 NOTE — Addendum Note (Signed)
 Addended by: Warner Mccreedy D on: 08/18/2023 07:19 PM   Modules accepted: Orders

## 2023-08-21 DIAGNOSIS — R0600 Dyspnea, unspecified: Secondary | ICD-10-CM | POA: Diagnosis not present

## 2023-08-21 DIAGNOSIS — G4733 Obstructive sleep apnea (adult) (pediatric): Secondary | ICD-10-CM | POA: Diagnosis not present

## 2023-08-24 ENCOUNTER — Encounter: Payer: Self-pay | Admitting: Obstetrics and Gynecology

## 2023-08-30 NOTE — Patient Instructions (Addendum)
 SURGICAL WAITING ROOM VISITATION  Patients having surgery or a procedure may have no more than 2 support people in the waiting area - these visitors may rotate.    Children under the age of 51 must have an adult with them who is not the patient.  Due to an increase in RSV and influenza rates and associated hospitalizations, children ages 18 and under may not visit patients in [facility] hospitals.  Visitors with respiratory illnesses are discouraged from visiting and should remain at home.  If the patient needs to stay at the hospital during part of their recovery, the visitor guidelines for inpatient rooms apply. Pre-op nurse will coordinate an appropriate time for 1 support person to accompany patient in pre-op.  This support person may not rotate.    Please refer to the [facility] website for the visitor guidelines for Inpatients (after your surgery is over and you are in a regular room).       Your procedure is scheduled on: 09-13-23   Report to [facility] Main Entrance    Report to admitting at    0630 AM   Call this number if you have problems the morning of surgery [phone]  Eat a light diet the day before surgery.  Examples including soups, broths, toast, yogurt, mashed potatoes.  Things to avoid include carbonated beverages (fizzy beverages), raw fruits and raw vegetables, or beans.   If your bowels are filled with gas, your surgeon will have difficulty visualizing your pelvic organs which increases your surgical risks.   Do not eat food :After Midnight.   After Midnight you may have the following liquids until __0530____ AM/  DAY OF SURGERY   then nothing by mouth  Water  Non-Citrus Juices (without pulp, NO RED-Apple, White grape, White cranberry) Black Coffee (NO MILK/CREAM OR CREAMERS, sugar ok)  Clear Tea (NO MILK/CREAM OR CREAMERS, sugar ok) regular and decaf                             Plain Jell-O (NO RED)                                            Fruit ices (not with fruit pulp, NO RED)                                     Popsicles (NO RED)                                                               Sports drinks like Gatorade (NO RED)                             If you have questions, please contact your surgeon's office.   FOLLOW  ANY ADDITIONAL PRE OP INSTRUCTIONS YOU RECEIVED FROM YOUR SURGEON'S OFFICE!!!     Oral Hygiene is also important to reduce your risk of infection.  Remember - BRUSH YOUR TEETH THE MORNING OF SURGERY WITH YOUR REGULAR TOOTHPASTE  DENTURES WILL BE REMOVED PRIOR TO SURGERY PLEASE DO NOT APPLY "Poly grip" OR ADHESIVES!!!   Do NOT smoke after Midnight   Stop all vitamins and herbal supplements 7 days before surgery.   Take these medicines the morning of surgery with A SIP OF WATER : Pantoprazole , metoprolol , levothyroxine , Buspirone , eye drops if needed, amlodipine   DO NOT TAKE ANY ORAL DIABETIC MEDICATIONS DAY OF YOUR SURGERY  Bring CPAP mask and tubing day of surgery.                              You may not have any metal on your body including hair pins, jewelry, and body piercing             Do not wear make-up, lotions, powders, perfumes/cologne, or deodorant  Do not wear nail polish including gel and S&S, artificial/acrylic nails, or any other type of covering on natural nails including finger and toenails. If you have artificial nails, gel coating, etc. that needs to be removed by a nail salon please have this removed prior to surgery or surgery may need to be canceled/ delayed if the surgeon/ anesthesia feels like they are unable to be safely monitored.   Do not shave  48 hours prior to surgery.      Do not bring valuables to the hospital. [facility] IS NOT             RESPONSIBLE   FOR VALUABLES.   Contacts, glasses, dentures or bridgework may not be worn into surgery.   Bring small overnight bag day of surgery.   DO NOT BRING YOUR HOME  MEDICATIONS TO THE HOSPITAL. PHARMACY WILL DISPENSE MEDICATIONS LISTED ON YOUR MEDICATION LIST TO YOU DURING YOUR ADMISSION IN THE HOSPITAL!    Patients discharged on the day of surgery will not be allowed to drive home.  Someone NEEDS to stay with you for the first 24 hours after anesthesia.   Special Instructions: Bring a copy of your healthcare power of attorney and living will documents the day of surgery if you haven't scanned them before.              Please read over the following fact sheets you were given: IF YOU HAVE QUESTIONS ABOUT YOUR PRE-OP INSTRUCTIONS PLEASE CALL [phone]   If you test positive for Covid or have been in contact with anyone that has tested positive in the last 10 days please notify you surgeon.    [facility] - Preparing for Surgery Before surgery, you can play an important role.  Because skin is not sterile, your skin needs to be as free of germs as possible.  You can reduce the number of germs on your skin by washing with CHG (chlorahexidine gluconate) soap before surgery.  CHG is an antiseptic cleaner which kills germs and bonds with the skin to continue killing germs even after washing. Please DO NOT use if you have an allergy to CHG or antibacterial soaps.  If your skin becomes reddened/irritated stop using the CHG and inform your nurse when you arrive at Short Stay. Do not shave (including legs and underarms) for at least 48 hours prior to the first CHG shower.  You may shave your face/neck. Please follow these instructions carefully:  1.  Shower with CHG Soap the night before surgery and the  morning of Surgery.  2.  If you choose to wash your hair, wash your hair first as usual with your  normal  shampoo.  3.  After you shampoo, rinse your hair and body thoroughly to remove the  shampoo.                           4.  Use CHG as you would any other liquid soap.  You can apply chg directly  to the skin and wash                       Gently with a scrungie  or clean washcloth.  5.  Apply the CHG Soap to your body ONLY FROM THE NECK DOWN.   Do not use on face/ open                           Wound or open sores. Avoid contact with eyes, ears mouth and genitals (private parts).                       Wash face,  Genitals (private parts) with your normal soap.             6.  Wash thoroughly, paying special attention to the area where your surgery  will be performed.  7.  Thoroughly rinse your body with warm water  from the neck down.  8.  DO NOT shower/wash with your normal soap after using and rinsing off  the CHG Soap.                9.  Pat yourself dry with a clean towel.            10.  Wear clean pajamas.            11.  Place clean sheets on your bed the night of your first shower and do not  sleep with pets. Day of Surgery : Do not apply any lotions/deodorants the morning of surgery.  Please wear clean clothes to the hospital/surgery center.  FAILURE TO FOLLOW THESE INSTRUCTIONS MAY RESULT IN THE CANCELLATION OF YOUR SURGERY PATIENT SIGNATURE_________________________________  NURSE SIGNATURE__________________________________  ________________________________________________________________________ WHAT IS A BLOOD TRANSFUSION? Blood Transfusion Information  A transfusion is the replacement of blood or some of its parts. Blood is made up of multiple cells which provide different functions. Red blood cells carry oxygen and are used for blood loss replacement. White blood cells fight against infection. Platelets control bleeding. Plasma helps clot blood. Other blood products are available for specialized needs, such as hemophilia or other clotting disorders. BEFORE THE TRANSFUSION  Who gives blood for transfusions?  Healthy volunteers who are fully evaluated to make sure their blood is safe. This is blood bank blood. Transfusion therapy is the safest it has ever been in the practice of medicine. Before blood is taken from a donor, a  complete history is taken to make sure that person has no history of diseases nor engages in risky social behavior (examples are intravenous drug use or sexual activity with multiple partners). The donor's travel history is screened to minimize risk of transmitting infections, such as malaria. The donated blood is tested for signs of infectious diseases, such as HIV and hepatitis. The blood is then tested to be sure it is compatible with you in order to minimize the chance of a transfusion reaction. If you or a relative donates  blood, this is often done in anticipation of surgery and is not appropriate for emergency situations. It takes many days to process the donated blood. RISKS AND COMPLICATIONS Although transfusion therapy is very safe and saves many lives, the main dangers of transfusion include:  Getting an infectious disease. Developing a transfusion reaction. This is an allergic reaction to something in the blood you were given. Every precaution is taken to prevent this. The decision to have a blood transfusion has been considered carefully by your caregiver before blood is given. Blood is not given unless the benefits outweigh the risks. AFTER THE TRANSFUSION Right after receiving a blood transfusion, you will usually feel much better and more energetic. This is especially true if your red blood cells have gotten low (anemic). The transfusion raises the level of the red blood cells which carry oxygen, and this usually causes an energy increase. The nurse administering the transfusion will monitor you carefully for complications. HOME CARE INSTRUCTIONS  No special instructions are needed after a transfusion. You may find your energy is better. Speak with your caregiver about any limitations on activity for underlying diseases you may have. SEEK MEDICAL CARE IF:  Your condition is not improving after your transfusion. You develop redness or irritation at the intravenous (IV) site. SEEK  IMMEDIATE MEDICAL CARE IF:  Any of the following symptoms occur over the next 12 hours: Shaking chills. You have a temperature by mouth above 102 F (38.9 C), not controlled by medicine. Chest, back, or muscle pain. People around you feel you are not acting correctly or are confused. Shortness of breath or difficulty breathing. Dizziness and fainting. You get a rash or develop hives. You have a decrease in urine output. Your urine turns a dark color or changes to pink, red, or brown. Any of the following symptoms occur over the next 10 days: You have a temperature by mouth above 102 F (38.9 C), not controlled by medicine. Shortness of breath. Weakness after normal activity. The white part of the eye turns yellow (jaundice). You have a decrease in the amount of urine or are urinating less often. Your urine turns a dark color or changes to pink, red, or brown. Document Released: 04/22/2000 Document Revised: 07/18/2011 Document Reviewed: 12/10/2007 [facility] Patient Information 2014 [place], [place].  _______________________________________________________________________

## 2023-08-30 NOTE — Progress Notes (Signed)
 PCP - Dr. Rolene Client  [facility]  Cardiologist -no   PPM/ICD -  Device Orders -  Rep Notified -   Chest x-ray -  EKG - 09-01-23 epic Stress Test -  ECHO - 06-03-23 epic Cardiac Cath -   Sleep Study - yes 07-31-23  Report on chart AHI 44.6 CPAP - no   Fasting Blood Sugar -  Checks Blood Sugar _n/a____ times a day  Blood Thinner Instructions:No Aspirin  Instructions:no   ERAS Protcol - PRE-SURGERY N/A    COVID vaccine -yes  Activity--Able to complete ADL's with no CP some SOB  when walks down a long hall.Lives in independent living at [facility] in [place]  Anesthesia review: OSA, HTN, Afib found out in January 2025 in hospital at [place]. Pt. Unsure if PCP aware at preop. Never been on blood thinner or asa for A-fib , K+ 3.0  Patient denies shortness of breath, fever, cough and chest pain at PAT appointment   All instructions explained to the patient, with a verbal understanding of the material. Patient agrees to go over the instructions while at home for a better understanding. Patient also instructed to self quarantine after being tested for COVID-19. The opportunity to ask questions was provided.

## 2023-08-31 NOTE — Telephone Encounter (Signed)
Received pulmonary clearance

## 2023-09-01 ENCOUNTER — Encounter (HOSPITAL_COMMUNITY): Payer: Self-pay

## 2023-09-01 ENCOUNTER — Other Ambulatory Visit: Payer: Self-pay

## 2023-09-01 ENCOUNTER — Encounter (HOSPITAL_COMMUNITY)
Admission: RE | Admit: 2023-09-01 | Discharge: 2023-09-01 | Disposition: A | Discharge status: Home / Self Care | Source: Ambulatory Visit | Attending: Gynecologic Oncology | Admitting: Gynecologic Oncology

## 2023-09-01 ENCOUNTER — Other Ambulatory Visit: Payer: Self-pay | Admitting: Gynecologic Oncology

## 2023-09-01 ENCOUNTER — Telehealth: Payer: Self-pay | Admitting: *Deleted

## 2023-09-01 VITALS — Ht 63.0 in | Wt 162.4 lb

## 2023-09-01 DIAGNOSIS — N9489 Other specified conditions associated with female genital organs and menstrual cycle: Secondary | ICD-10-CM

## 2023-09-01 DIAGNOSIS — Z01812 Encounter for preprocedural laboratory examination: Secondary | ICD-10-CM | POA: Diagnosis present

## 2023-09-01 DIAGNOSIS — E876 Hypokalemia: Secondary | ICD-10-CM

## 2023-09-01 DIAGNOSIS — Z01810 Encounter for preprocedural cardiovascular examination: Secondary | ICD-10-CM | POA: Diagnosis present

## 2023-09-01 DIAGNOSIS — R9431 Abnormal electrocardiogram [ECG] [EKG]: Secondary | ICD-10-CM | POA: Insufficient documentation

## 2023-09-01 DIAGNOSIS — Z01818 Encounter for other preprocedural examination: Secondary | ICD-10-CM | POA: Diagnosis not present

## 2023-09-01 DIAGNOSIS — I10 Essential (primary) hypertension: Secondary | ICD-10-CM | POA: Diagnosis not present

## 2023-09-01 HISTORY — DX: Anxiety disorder, unspecified: F41.9

## 2023-09-01 HISTORY — DX: Cardiac arrhythmia, unspecified: I49.9

## 2023-09-01 LAB — COMPREHENSIVE METABOLIC PANEL WITH GFR
ALT: 11 U/L (ref 0–44)
AST: 18 U/L (ref 15–41)
Albumin: 3.7 g/dL (ref 3.5–5.0)
Alkaline Phosphatase: 61 U/L (ref 38–126)
Anion gap: 11 (ref 5–15)
BUN: 11 mg/dL (ref 8–23)
CO2: 29 mmol/L (ref 22–32)
Calcium: 9.2 mg/dL (ref 8.9–10.3)
Chloride: 98 mmol/L (ref 98–111)
Creatinine, Ser: 0.99 mg/dL (ref 0.44–1.00)
GFR, Estimated: 53 mL/min — ABNORMAL LOW (ref >60)
Glucose, Bld: 108 mg/dL — ABNORMAL HIGH (ref 70–99)
Potassium: 3 mmol/L — ABNORMAL LOW (ref 3.5–5.1)
Sodium: 138 mmol/L (ref 135–145)
Total Bilirubin: 0.7 mg/dL (ref 0.0–1.2)
Total Protein: 7 g/dL (ref 6.5–8.1)

## 2023-09-01 LAB — CBC
HCT: 42.3 % (ref 36.0–46.0)
Hemoglobin: 13.7 g/dL (ref 12.0–15.0)
MCH: 29.3 pg (ref 26.0–34.0)
MCHC: 32.4 g/dL (ref 30.0–36.0)
MCV: 90.6 fL (ref 80.0–100.0)
Platelets: 276 10*3/uL (ref 150–400)
RBC: 4.67 MIL/uL (ref 3.87–5.11)
RDW: 14.9 % (ref 11.5–15.5)
WBC: 6.8 10*3/uL (ref 4.0–10.5)
nRBC: 0 % (ref 0.0–0.2)

## 2023-09-01 MED ORDER — POTASSIUM CHLORIDE CRYS ER 20 MEQ PO TBCR: 20.0000 meq | EXTENDED_RELEASE_TABLET | Freq: Two times a day (BID) | ORAL | 0 refills | Status: AC

## 2023-09-01 NOTE — Progress Notes (Signed)
 Potassium supplementation sent in based on K+ of 3.0 preop

## 2023-09-01 NOTE — Telephone Encounter (Signed)
-----   Message from Suellyn Emory sent at 09/01/2023  3:31 PM EDT ----- On this patient's preop labs, her potassium is 3.0. Does she take potassium? I see she is on lasix. I can send in a short course to get this improved by surgery date. She can increase intake of potassium rich foods but not in excess. ----- Message ----- From: Dannis Dy, Lab In [place] Sent: 09/01/2023   2:27 PM EDT To: Suellyn Emory, NP

## 2023-09-01 NOTE — Telephone Encounter (Signed)
 Spoke with Ms. Daina Drum and relayed message from Vira Grieves, NP on patient's pre-op labs -potassium is a little low 3.0. Pt states she used to take potassium but not recently. Vira Grieves, NP has sent in a short course of potassium to get this improved by surgery date. Pt also advised to increase intake of potassium rich foods like (raisons,bananas, sweet potatoes, spinach) but not in excess.  Pt verbalized understanding and thanked the office for calling.

## 2023-09-05 ENCOUNTER — Encounter: Payer: Self-pay | Admitting: Oncology

## 2023-09-05 DIAGNOSIS — I4891 Unspecified atrial fibrillation: Secondary | ICD-10-CM

## 2023-09-05 NOTE — Progress Notes (Signed)
 Urgent referral placed to cardiology per Vira Grieves, NP.    Called [facility] and an appointment is not available until the week of 09/19/23.  Called [facility] in [place] and appointment has been scheduled for 11:00 tomorrow.  Called Willetta Harpin (Niece) and notified her of appointment tomorrow. Also called [place] and advised her of appointment date, time and location.  Surgical optimization form faxed to Randol Butt, NP at [facility] [phone]).

## 2023-09-06 DIAGNOSIS — K219 Gastro-esophageal reflux disease without esophagitis: Secondary | ICD-10-CM | POA: Insufficient documentation

## 2023-09-06 DIAGNOSIS — N939 Abnormal uterine and vaginal bleeding, unspecified: Secondary | ICD-10-CM

## 2023-09-06 DIAGNOSIS — I3139 Other pericardial effusion (noninflammatory): Secondary | ICD-10-CM | POA: Diagnosis not present

## 2023-09-06 DIAGNOSIS — I4891 Unspecified atrial fibrillation: Secondary | ICD-10-CM | POA: Diagnosis not present

## 2023-09-06 DIAGNOSIS — I499 Cardiac arrhythmia, unspecified: Secondary | ICD-10-CM | POA: Insufficient documentation

## 2023-09-06 DIAGNOSIS — H60501 Unspecified acute noninfective otitis externa, right ear: Secondary | ICD-10-CM | POA: Insufficient documentation

## 2023-09-06 DIAGNOSIS — F419 Anxiety disorder, unspecified: Secondary | ICD-10-CM | POA: Insufficient documentation

## 2023-09-06 DIAGNOSIS — K5900 Constipation, unspecified: Secondary | ICD-10-CM | POA: Insufficient documentation

## 2023-09-06 DIAGNOSIS — M1990 Unspecified osteoarthritis, unspecified site: Secondary | ICD-10-CM | POA: Insufficient documentation

## 2023-09-06 DIAGNOSIS — H9201 Otalgia, right ear: Secondary | ICD-10-CM | POA: Insufficient documentation

## 2023-09-06 DIAGNOSIS — D126 Benign neoplasm of colon, unspecified: Secondary | ICD-10-CM | POA: Insufficient documentation

## 2023-09-06 DIAGNOSIS — R29818 Other symptoms and signs involving the nervous system: Secondary | ICD-10-CM | POA: Insufficient documentation

## 2023-09-06 DIAGNOSIS — J189 Pneumonia, unspecified organism: Secondary | ICD-10-CM | POA: Insufficient documentation

## 2023-09-06 HISTORY — DX: Unspecified acute noninfective otitis externa, right ear: H60.501

## 2023-09-06 HISTORY — DX: Constipation, unspecified: K59.00

## 2023-09-06 HISTORY — DX: Otalgia, right ear: H92.01

## 2023-09-06 HISTORY — DX: Other symptoms and signs involving the nervous system: R29.818

## 2023-09-06 HISTORY — DX: Abnormal uterine and vaginal bleeding, unspecified: N93.9

## 2023-09-07 ENCOUNTER — Other Ambulatory Visit: Payer: Self-pay | Admitting: Oncology

## 2023-09-07 ENCOUNTER — Encounter: Payer: Self-pay | Admitting: Oncology

## 2023-09-07 DIAGNOSIS — I700 Atherosclerosis of aorta: Secondary | ICD-10-CM | POA: Diagnosis not present

## 2023-09-07 NOTE — Progress Notes (Signed)
 Called and Faxed clarification request on Eliquis - when to hold before surgery to [facility].

## 2023-09-08 ENCOUNTER — Telehealth: Payer: Self-pay | Admitting: Oncology

## 2023-09-08 DIAGNOSIS — H353231 Exudative age-related macular degeneration, bilateral, with active choroidal neovascularization: Secondary | ICD-10-CM | POA: Diagnosis not present

## 2023-09-08 DIAGNOSIS — G4733 Obstructive sleep apnea (adult) (pediatric): Secondary | ICD-10-CM | POA: Diagnosis not present

## 2023-09-08 NOTE — Telephone Encounter (Signed)
 Received PCP clearance.

## 2023-09-08 NOTE — Telephone Encounter (Signed)
 [facility] and let her know to stop taking the Eliquis 2 days before surgery (last dose on 09/10/23).  She verbalized understanding and agreement and has not started the medication yet.    Also let her know the appointment with [facility] in [place] on 09/12/23 has been canceled because she was seen at [facility].  Also called Willetta Harpin (niece) and advised her of the recommendations above.  Willetta Harpin did mention that Fritzi had an echo yesterday but does not know the results yet.

## 2023-09-11 ENCOUNTER — Encounter (HOSPITAL_COMMUNITY): Payer: Self-pay

## 2023-09-11 NOTE — Anesthesia Preprocedure Evaluation (Signed)
 Anesthesia Evaluation  Patient identified by MRN, date of birth, ID band Patient awake    Reviewed: Allergy & Precautions, NPO status , Patient's Chart, lab work & pertinent test results  History of Anesthesia Complications Negative for: history of anesthetic complications  Airway Mallampati: II  TM Distance: >3 FB Neck ROM: Full    Dental  (+) Dental Advisory Given, Teeth Intact   Pulmonary sleep apnea (no longer requires CPAP)  03/06/2020 SARS coronavirus NEG   Pulmonary exam normal breath sounds clear to auscultation       Cardiovascular hypertension, Pt. on medications and Pt. on home beta blockers (-) angina + dysrhythmias Atrial Fibrillation  Rhythm:Irregular Rate:Normal  '08 ECHO: EF 65%, no significant valvular abnormalities   Neuro/Psych negative neurological ROS     GI/Hepatic Neg liver ROS, hiatal hernia,GERD  Medicated and Controlled,,  Endo/Other  Hypothyroidism  obese  Renal/GU negative Renal ROS     Musculoskeletal  (+) Arthritis ,    Abdominal  (+) + obese  Peds  Hematology negative hematology ROS (+)   Anesthesia Other Findings   Reproductive/Obstetrics                             Anesthesia Physical Anesthesia Plan  ASA: 3  Anesthesia Plan: General   Post-op Pain Management: Tylenol  PO (pre-op)*   Induction:   PONV Risk Score and Plan: 2 and Ondansetron , Treatment may vary due to age or medical condition and Dexamethasone   Airway Management Planned: Oral ETT  Additional Equipment: None  Intra-op Plan:   Post-operative Plan: Extubation in OR  Informed Consent: I have reviewed the patients History and Physical, chart, labs and discussed the procedure including the risks, benefits and alternatives for the proposed anesthesia with the patient or authorized representative who has indicated his/her understanding and acceptance.     Dental advisory  given  Plan Discussed with: CRNA  Anesthesia Plan Comments: (See PAT note from 4/25)        Anesthesia Quick Evaluation

## 2023-09-11 NOTE — Progress Notes (Signed)
 Case: [id number] Date/Time: 09/13/23 0815   Procedure: HYSTERECTOMY, TOTAL, ROBOT-ASSISTED, LAPAROSCOPIC, WITH BILATERAL SALPINGO-OOPHORECTOMY (Bilateral) - POSS STAGING   Anesthesia type: General   Pre-op diagnosis: ADNEXAL MASS,POST MENOPAUSAL BLEEDING,THICKEN ENDOMETRIUM   Location: [facility] ROOM 05 / [facility]   Surgeons: [name], MD       DISCUSSION: Gina Waller is a 89 yo female who presents to PAT prior to surgery above. PMH of HTN, A.fib, severe OSA (no CPAP use), hx of TIA, GERD, hiatal hernia, hypothyroidism, RA, OA.   Patient had initial consult for A.fib on 09/06/23 at [facility]. Pt has had A.fib for some time which appears to be persistent however had not been evaluated for this in the past. Rate controlled but not taking a blood thinner. Echo was done on 5/1 which showed normal LVEF and mild LVH. She was prescribed Eliquis. She was cleared for surgery:  "Preoperative clearance for hysterectomy. She is scheduled for a hysterectomy on 09/13/2023 to remove a tumor on the uterus and something on the right ovary. The procedure will be done laparoscopically. Given her well-controlled heart rate and stable condition, she is deemed fit for surgery, provided she starts on a blood thinner. The preoperative clearance form from her doctor will be completed and sent to Dr. Maybelle Spatz at [facility]."  Patient follows with Pulmonology in [place] at [facility]. She recently had a sleep study on 07/31/23 (physical copy in chart) which showed she has severe OSA (AHI level 44.6) and CPAP recommended. Pt does not have CPAP at this time. Pulm clearance received patient is moderate risk and optimized (scanned in media on 4/30).  LD Eliquis: 5/4  VS: Ht 5\' 3"  (1.6 m)   Wt 73.7 kg   BMI 28.77 kg/m   PROVIDERS: Avva, Ravisankar, MD Cardiology: Nels Banda, NP ([facility]) Pulmonology: Theodis Fiscal [facility])  LABS: Labs reviewed: Acceptable  for surgery. Mild hypokalemia - supplemented by surgeon's office (all labs ordered are listed, but only abnormal results are displayed)  Labs Reviewed  COMPREHENSIVE METABOLIC PANEL WITH GFR - Abnormal; Notable for the following components:      Result Value   Potassium 3.0 (*)    Glucose, Bld 108 (*)    GFR, Estimated 53 (*)    All other components within normal limits  CBC  TYPE AND SCREEN     IMAGES: MRI pelvis 07/24/23:  IMPRESSION: 1. Complex right adnexal/ovarian lesion measuring 6.2 by 5.9 by 5.6 cm, with thickened septations separating regions of variable T2-10 variable but mostly high T1 signal intensity. Thick internal septations, thick enhancing margin, and some internal nodularity along septations. MRI appearance not typical for fibroid tumor and ovarian malignancy is a distinct possibility. Correlate with tumor markers and surgical evaluation is likely indicated. 2. Thickened junctional zone at 1.6 cm suggesting adenomyosis. 3. 0.7 by 0.5 by 0.5 cm low T2 signal intensity lesion along the anterior fundal margin of the endometrium, probably a small subserosal fibroid, small endometrial polyp is a less likely differential diagnostic consideration. 4. Sigmoid colon diverticulosis. 5. Postoperative findings in the lumbar spine with lower lumbar spondylosis and degenerative disc disease.  EKG 09/01/23:  Atrial fibrillation, rate 71 Left axis deviation Anterolateral infarct , age undetermined  CV:  Echo 09/07/23: SUMMARY  There is mild concentric left ventricular hypertrophy.  Left ventricular systolic function is normal.  LV ejection fraction = 55-60%.  The right ventricle is mildly dilated.  The right atrium is mildly dilated.  There is aortic  valve sclerosis.  There is no aortic stenosis.  There is trace aortic regurgitation.  There is no pericardial effusion.  There is no comparison study available.   Past Medical History:  Diagnosis Date   Acute  otitis externa of right ear 09/06/2023   Adenomatous colon polyp    Angioma of skin 07/07/2023   Anxiety    Atypical chest pain 04/27/2023   Basal cell carcinoma of chest 2021   Basal cell carcinoma of right shoulder 07/07/2023   Cataract 04/27/2023   Chronic respiratory failure with hypoxia (HCC) 06/20/2023   Constipation 09/06/2023   Cough 11/04/2019   DDD (degenerative disc disease), cervical 04/27/2023   Disequilibrium 10/04/2017   Disorder of bone 03/09/2009   Disorder of skin or subcutaneous tissue 10/04/2017   Dysrhythmia    Afib   Encounter for general adult medical examination without abnormal findings 12/22/2014   First degree heart block 04/27/2023   GERD (gastroesophageal reflux disease)    Hemorrhoids    Hiatal hernia 04/27/2023   High cholesterol    History of actinic keratosis 07/07/2023   Hypercalcemia 04/27/2023   Hyperlipidemia 04/27/2023   Hypertension    Hypertensive disorder 04/27/2023   Hypothyroidism    Inflamed seborrheic keratosis 07/07/2023   Insomnia 02/28/2018   Lack of coordination 10/04/2017   Lentigo 07/07/2023   Macular degeneration 04/27/2023   Getting ocular injections on the left eye.     Milia 07/07/2023   Neck pain 03/18/2019   Neoplasm of uncertain behavior of skin 07/07/2023   Neurogenic claudication 09/06/2023   Nevus of right foot 07/07/2023   Not up to date with scheduled immunizations 02/25/2010   Obesity 02/16/2011   Osteoarthritis    Osteopenia 04/27/2023   Overactive bladder 04/27/2023   Paroxysmal atrial fibrillation (HCC) 06/20/2023   Pneumonia    as a child   Pneumonia 05/2023   Polyp of colon 04/27/2023   Primary osteoarthritis of right knee 01/10/2014   Psoriasis 07/07/2023   Rheumatoid arthritis (HCC) 04/27/2023   Right ear pain 09/06/2023   S/P total knee arthroplasty, right 03/09/2020   Seasonal allergic rhinitis 03/09/2009   Seborrheic keratoses 07/07/2023   Sleep apnea    remote diagnosis, no CPAP    Spinal stenosis of lumbar region 04/27/2023   Sun-damaged skin 07/07/2023   Transient ischemic attack 07/07/2009   Vaginal bleeding 09/06/2023    Past Surgical History:  Procedure Laterality Date   BREAST REDUCTION SURGERY     At age 79   DILATION AND CURETTAGE OF UTERUS  2012   to remove uterine polyps   TONSILLECTOMY     TOTAL KNEE ARTHROPLASTY Left    TOTAL KNEE ARTHROPLASTY Right 03/09/2020   Procedure: TOTAL KNEE ARTHROPLASTY;  Surgeon: Christie Cox, MD;  Location: [facility];  Service: Orthopedics;  Laterality: Right;    MEDICATIONS:  albuterol (VENTOLIN HFA) 108 (90 Base) MCG/ACT inhaler   amLODipine  (NORVASC ) 5 MG tablet   apixaban (ELIQUIS) 5 MG TABS tablet   busPIRone (BUSPAR) 5 MG tablet   carboxymethylcellulose 1 % ophthalmic solution   cholecalciferol (VITAMIN D) 1000 UNITS tablet   cyanocobalamin (VITAMIN B12) 1000 MCG tablet   estradiol (ESTRACE) 0.1 MG/GM vaginal cream   furosemide (LASIX) 40 MG tablet   levothyroxine  (SYNTHROID , LEVOTHROID) 50 MCG tablet   metoprolol  succinate (TOPROL -XL) 50 MG 24 hr tablet   NON FORMULARY   pantoprazole  (PROTONIX ) 40 MG tablet   potassium chloride  SA (KLOR-CON  M) 20 MEQ tablet   predniSONE  (DELTASONE ) 20 MG  tablet   rosuvastatin  (CRESTOR ) 20 MG tablet   senna-docusate (SENOKOT-S) 8.6-50 MG tablet   traMADol  (ULTRAM ) 50 MG tablet   No current facility-administered medications for this encounter.   Antoinette Kirschner [facility] Short Stay/Anesthesiology [facility] Phone [phone] 09/11/2023 11:42 AM

## 2023-09-12 ENCOUNTER — Telehealth: Payer: Self-pay

## 2023-09-12 ENCOUNTER — Ambulatory Visit

## 2023-09-12 DIAGNOSIS — C44519 Basal cell carcinoma of skin of other part of trunk: Secondary | ICD-10-CM

## 2023-09-12 DIAGNOSIS — F419 Anxiety disorder, unspecified: Secondary | ICD-10-CM

## 2023-09-12 LAB — TYPE AND SCREEN
ABO/RH(D): A POS
Antibody Screen: NEGATIVE

## 2023-09-12 NOTE — Telephone Encounter (Signed)
 Telephone call to check on pre-operative status. I spoke to pt's niece, Joella Musa.Patient compliant with pre-operative instructions. Reinforced nothing to eat after midnight. Clear liquids until 0515. Patient to arrive at 0615.  No questions or concerns voiced.  Instructed to call for any needs.  Joella Musa states Ms.Fees has not started the Eliquis per provider advice.

## 2023-09-12 NOTE — Discharge Instructions (Signed)
 AFTER SURGERY INSTRUCTIONS   Return to work: 4-6 weeks if applicable  Plan on holding the Eliquis blood thinner for 7 days after surgery. On the 7th day, if you are having no bleeding, you can start taking the 5 mg twice daily.    Activity: 1. Be up and out of the bed during the day.  Take a nap if needed.  You may walk up steps but be careful and use the hand rail.  Stair climbing will tire you more than you think, you may need to stop part way and rest.    2. No lifting or straining for 6 weeks over 10 pounds. No pushing, pulling, straining for 6 weeks.   3. No driving for 1-30 days when the following criteria have been met: Do not drive if you are taking narcotic pain medicine and make sure that your reaction time has returned.    4. You can shower as soon as the next day after surgery. Shower daily.  Use your regular soap and water  (not directly on the incision) and pat your incision(s) dry afterwards; don't rub.  No tub baths or submerging your body in water  until cleared by your surgeon. If you have the soap that was given to you by pre-surgical testing that was used before surgery, you do not need to use it afterwards because this can irritate your incisions.    5. No sexual activity and nothing in the vagina for 10-12 weeks.   6. You may experience a small amount of clear drainage from your incisions, which is normal.  If the drainage persists, increases, or changes color please call the office.   7. Do not use creams, lotions, or ointments such as neosporin on your incisions after surgery until advised by your surgeon because they can cause removal of the dermabond glue on your incisions.     8. You may experience vaginal spotting after surgery or when the stitches at the top of the vagina begin to dissolve.  The spotting is normal but if you experience heavy bleeding, call our office.   9. Take Tylenol  first for pain if you are able to take these medication and only use narcotic pain  medication for severe pain not relieved by the Tylenol .  Monitor your Tylenol  intake to a max of 4,000 mg in a 24 hour period.    Diet: 1. Low sodium Heart Healthy Diet is recommended but you are cleared to resume your normal (before surgery) diet after your procedure.   2. It is safe to use a laxative, such as Miralax or Colace, if you have difficulty moving your bowels before surgery. You have been prescribed Sennakot-S to take at bedtime every evening after surgery to keep bowel movements regular and to prevent constipation.     Wound Care: 1. Keep clean and dry.  Shower daily.   Reasons to call the Doctor: Fever - Oral temperature greater than 100.4 degrees Fahrenheit Foul-smelling vaginal discharge Difficulty urinating Nausea and vomiting Increased pain at the site of the incision that is unrelieved with pain medicine. Difficulty breathing with or without chest pain New calf pain especially if only on one side Sudden, continuing increased vaginal bleeding with or without clots.   Contacts: For questions or concerns you should contact:   Dr. Wiley Hanger at [phone]   Vira Grieves, NP at [phone]   After Hours: call [phone] and have the GYN Oncologist paged/contacted (after 5 pm or on the weekends). You will speak with  an after hours RN and let he or she know you have had surgery.   Messages sent via mychart are for non-urgent matters and are not responded to after hours so for urgent needs, please call the after hours number.

## 2023-09-13 ENCOUNTER — Ambulatory Visit (HOSPITAL_COMMUNITY): Payer: Self-pay | Admitting: Physician Assistant

## 2023-09-13 ENCOUNTER — Encounter (HOSPITAL_COMMUNITY): Payer: Self-pay | Admitting: Gynecologic Oncology

## 2023-09-13 ENCOUNTER — Inpatient Hospital Stay (HOSPITAL_COMMUNITY)
Admission: AD | Admit: 2023-09-13 | Discharge: 2023-09-14 | DRG: 738 | Disposition: A | Discharge status: Home / Self Care | Attending: Gynecologic Oncology | Admitting: Gynecologic Oncology

## 2023-09-13 ENCOUNTER — Other Ambulatory Visit: Payer: Self-pay

## 2023-09-13 ENCOUNTER — Encounter (HOSPITAL_COMMUNITY)
Admission: AD | Disposition: A | Discharge status: Home / Self Care | Payer: Self-pay | Source: Home / Self Care | Attending: Gynecologic Oncology

## 2023-09-13 ENCOUNTER — Ambulatory Visit (HOSPITAL_COMMUNITY): Payer: Self-pay | Admitting: Anesthesiology

## 2023-09-13 DIAGNOSIS — R9389 Abnormal findings on diagnostic imaging of other specified body structures: Secondary | ICD-10-CM | POA: Diagnosis not present

## 2023-09-13 DIAGNOSIS — N838 Other noninflammatory disorders of ovary, fallopian tube and broad ligament: Secondary | ICD-10-CM | POA: Diagnosis not present

## 2023-09-13 DIAGNOSIS — N9489 Other specified conditions associated with female genital organs and menstrual cycle: Principal | ICD-10-CM | POA: Diagnosis present

## 2023-09-13 DIAGNOSIS — Z800 Family history of malignant neoplasm of digestive organs: Secondary | ICD-10-CM | POA: Diagnosis not present

## 2023-09-13 DIAGNOSIS — Z96651 Presence of right artificial knee joint: Secondary | ICD-10-CM | POA: Diagnosis present

## 2023-09-13 DIAGNOSIS — K5900 Constipation, unspecified: Secondary | ICD-10-CM | POA: Diagnosis present

## 2023-09-13 DIAGNOSIS — Z803 Family history of malignant neoplasm of breast: Secondary | ICD-10-CM | POA: Diagnosis not present

## 2023-09-13 DIAGNOSIS — Z85828 Personal history of other malignant neoplasm of skin: Secondary | ICD-10-CM

## 2023-09-13 DIAGNOSIS — Z860101 Personal history of adenomatous and serrated colon polyps: Secondary | ICD-10-CM | POA: Diagnosis not present

## 2023-09-13 DIAGNOSIS — K5730 Diverticulosis of large intestine without perforation or abscess without bleeding: Secondary | ICD-10-CM | POA: Diagnosis present

## 2023-09-13 DIAGNOSIS — N8502 Endometrial intraepithelial neoplasia [EIN]: Secondary | ICD-10-CM | POA: Diagnosis present

## 2023-09-13 DIAGNOSIS — E039 Hypothyroidism, unspecified: Secondary | ICD-10-CM | POA: Diagnosis not present

## 2023-09-13 DIAGNOSIS — N950 Postmenopausal bleeding: Secondary | ICD-10-CM

## 2023-09-13 DIAGNOSIS — D3911 Neoplasm of uncertain behavior of right ovary: Secondary | ICD-10-CM

## 2023-09-13 DIAGNOSIS — E876 Hypokalemia: Secondary | ICD-10-CM

## 2023-09-13 DIAGNOSIS — I10 Essential (primary) hypertension: Secondary | ICD-10-CM | POA: Diagnosis not present

## 2023-09-13 DIAGNOSIS — J449 Chronic obstructive pulmonary disease, unspecified: Secondary | ICD-10-CM | POA: Diagnosis not present

## 2023-09-13 DIAGNOSIS — C561 Malignant neoplasm of right ovary: Secondary | ICD-10-CM | POA: Diagnosis not present

## 2023-09-13 DIAGNOSIS — L299 Pruritus, unspecified: Secondary | ICD-10-CM | POA: Diagnosis not present

## 2023-09-13 DIAGNOSIS — K828 Other specified diseases of gallbladder: Secondary | ICD-10-CM | POA: Diagnosis present

## 2023-09-13 DIAGNOSIS — N839 Noninflammatory disorder of ovary, fallopian tube and broad ligament, unspecified: Secondary | ICD-10-CM | POA: Diagnosis not present

## 2023-09-13 DIAGNOSIS — K660 Peritoneal adhesions (postprocedural) (postinfection): Secondary | ICD-10-CM | POA: Diagnosis not present

## 2023-09-13 DIAGNOSIS — E7800 Pure hypercholesterolemia, unspecified: Secondary | ICD-10-CM | POA: Diagnosis not present

## 2023-09-13 DIAGNOSIS — Z8701 Personal history of pneumonia (recurrent): Secondary | ICD-10-CM | POA: Diagnosis not present

## 2023-09-13 DIAGNOSIS — I480 Paroxysmal atrial fibrillation: Secondary | ICD-10-CM | POA: Diagnosis not present

## 2023-09-13 HISTORY — PX: ROBOTIC ASSISTED TOTAL HYSTERECTOMY WITH BILATERAL SALPINGO OOPHERECTOMY: SHX6086

## 2023-09-13 LAB — BASIC METABOLIC PANEL WITH GFR
Anion gap: 12 (ref 5–15)
BUN: 11 mg/dL (ref 8–23)
CO2: 26 mmol/L (ref 22–32)
Calcium: 9.1 mg/dL (ref 8.9–10.3)
Chloride: 93 mmol/L — ABNORMAL LOW (ref 98–111)
Creatinine, Ser: 0.78 mg/dL (ref 0.44–1.00)
GFR, Estimated: >60 mL/min (ref >60)
Glucose, Bld: 94 mg/dL (ref 70–99)
Potassium: 4.3 mmol/L (ref 3.5–5.1)
Sodium: 131 mmol/L — ABNORMAL LOW (ref 135–145)

## 2023-09-13 SURGERY — HYSTERECTOMY, TOTAL, ROBOT-ASSISTED, LAPAROSCOPIC, WITH BILATERAL SALPINGO-OOPHORECTOMY
Anesthesia: General | Site: Pelvis | Laterality: Bilateral

## 2023-09-13 MED ORDER — ONDANSETRON HCL 4 MG/2ML IJ SOLN: 4.0000 mg | Freq: Four times a day (QID) | INTRAMUSCULAR | Status: DC | PRN

## 2023-09-13 MED ORDER — SUGAMMADEX SODIUM 200 MG/2ML IV SOLN
INTRAVENOUS | Status: DC | PRN
  Administered 2023-09-13: 200 mg via INTRAVENOUS

## 2023-09-13 MED ORDER — DEXAMETHASONE SODIUM PHOSPHATE 10 MG/ML IJ SOLN
INTRAMUSCULAR | Status: AC
  Filled 2023-09-13: qty 1

## 2023-09-13 MED ORDER — ALBUTEROL SULFATE (2.5 MG/3ML) 0.083% IN NEBU: 2.5000 mg | INHALATION_SOLUTION | RESPIRATORY_TRACT | Status: DC | PRN

## 2023-09-13 MED ORDER — ACETAMINOPHEN 500 MG PO TABS
500.0000 mg | ORAL_TABLET | ORAL | Status: AC
  Administered 2023-09-13: 500 mg via ORAL
  Filled 2023-09-13: qty 1

## 2023-09-13 MED ORDER — METOPROLOL SUCCINATE ER 50 MG PO TB24
50.0000 mg | ORAL_TABLET | Freq: Every day | ORAL | Status: DC
  Administered 2023-09-14: 50 mg via ORAL
  Filled 2023-09-13: qty 1

## 2023-09-13 MED ORDER — PHENYLEPHRINE 80 MCG/ML (10ML) SYRINGE FOR IV PUSH (FOR BLOOD PRESSURE SUPPORT)
PREFILLED_SYRINGE | INTRAVENOUS | Status: DC | PRN
  Administered 2023-09-13 (×2): 80 ug via INTRAVENOUS

## 2023-09-13 MED ORDER — PHENYLEPHRINE HCL-NACL 20-0.9 MG/250ML-% IV SOLN
INTRAVENOUS | Status: DC | PRN
  Administered 2023-09-13: 40 ug/min via INTRAVENOUS

## 2023-09-13 MED ORDER — CARBOXYMETHYLCELLULOSE SODIUM 1 % OP SOLN: 1.0000 [drp] | Freq: Two times a day (BID) | OPHTHALMIC | Status: DC | PRN

## 2023-09-13 MED ORDER — PROPOFOL 500 MG/50ML IV EMUL
INTRAVENOUS | Status: AC
  Filled 2023-09-13: qty 50

## 2023-09-13 MED ORDER — DEXAMETHASONE SODIUM PHOSPHATE 10 MG/ML IJ SOLN
4.0000 mg | INTRAMUSCULAR | Status: AC
  Administered 2023-09-13: 4 mg via INTRAVENOUS

## 2023-09-13 MED ORDER — SENNOSIDES-DOCUSATE SODIUM 8.6-50 MG PO TABS
2.0000 | ORAL_TABLET | Freq: Every day | ORAL | Status: DC
  Administered 2023-09-13: 2 via ORAL
  Filled 2023-09-13: qty 2

## 2023-09-13 MED ORDER — DROPERIDOL 2.5 MG/ML IJ SOLN: 0.6250 mg | Freq: Once | INTRAMUSCULAR | Status: DC | PRN

## 2023-09-13 MED ORDER — ACETAMINOPHEN 500 MG PO TABS
500.0000 mg | ORAL_TABLET | Freq: Four times a day (QID) | ORAL | Status: DC
Start: 2023-09-13 — End: 2023-09-14
  Administered 2023-09-13 – 2023-09-14 (×4): 500 mg via ORAL
  Filled 2023-09-13 (×4): qty 1

## 2023-09-13 MED ORDER — BUPIVACAINE HCL 0.25 % IJ SOLN
INTRAMUSCULAR | Status: DC | PRN
  Administered 2023-09-13: 20 mL

## 2023-09-13 MED ORDER — LIDOCAINE HCL (PF) 2 % IJ SOLN
INTRAMUSCULAR | Status: AC
  Filled 2023-09-13: qty 5

## 2023-09-13 MED ORDER — PROPOFOL 10 MG/ML IV BOLUS
INTRAVENOUS | Status: DC | PRN
  Administered 2023-09-13: 90 mg via INTRAVENOUS

## 2023-09-13 MED ORDER — PHENYLEPHRINE 80 MCG/ML (10ML) SYRINGE FOR IV PUSH (FOR BLOOD PRESSURE SUPPORT)
PREFILLED_SYRINGE | INTRAVENOUS | Status: AC
  Filled 2023-09-13: qty 20

## 2023-09-13 MED ORDER — LACTATED RINGERS IV SOLN: INTRAVENOUS | Status: DC

## 2023-09-13 MED ORDER — PHENYLEPHRINE HCL (PRESSORS) 10 MG/ML IV SOLN
INTRAVENOUS | Status: AC
  Filled 2023-09-13: qty 1

## 2023-09-13 MED ORDER — CEFAZOLIN SODIUM-DEXTROSE 2-4 GM/100ML-% IV SOLN
2.0000 g | INTRAVENOUS | Status: AC
  Administered 2023-09-13: 2 g via INTRAVENOUS
  Filled 2023-09-13: qty 100

## 2023-09-13 MED ORDER — ROCURONIUM BROMIDE 100 MG/10ML IV SOLN
INTRAVENOUS | Status: DC | PRN
  Administered 2023-09-13: 60 mg via INTRAVENOUS
  Administered 2023-09-13: 10 mg via INTRAVENOUS
  Administered 2023-09-13: 20 mg via INTRAVENOUS
  Administered 2023-09-13: 10 mg via INTRAVENOUS

## 2023-09-13 MED ORDER — BUPIVACAINE HCL (PF) 0.25 % IJ SOLN
INTRAMUSCULAR | Status: AC
  Filled 2023-09-13: qty 30

## 2023-09-13 MED ORDER — ORAL CARE MOUTH RINSE: 15.0000 mL | Freq: Once | OROMUCOSAL | Status: AC

## 2023-09-13 MED ORDER — LACTATED RINGERS IV SOLN: INTRAVENOUS | Status: DC | PRN

## 2023-09-13 MED ORDER — OXYCODONE HCL 5 MG PO TABS
5.0000 mg | ORAL_TABLET | ORAL | Status: DC | PRN
Start: 2023-09-13 — End: 2023-09-14

## 2023-09-13 MED ORDER — ONDANSETRON HCL 4 MG PO TABS: 4.0000 mg | ORAL_TABLET | Freq: Four times a day (QID) | ORAL | Status: DC | PRN

## 2023-09-13 MED ORDER — LIDOCAINE HCL (CARDIAC) PF 100 MG/5ML IV SOSY
PREFILLED_SYRINGE | INTRAVENOUS | Status: DC | PRN
  Administered 2023-09-13: 70 mg via INTRAVENOUS

## 2023-09-13 MED ORDER — KETAMINE HCL 10 MG/ML IJ SOLN
INTRAMUSCULAR | Status: DC | PRN
  Administered 2023-09-13: 15 mg via INTRAVENOUS

## 2023-09-13 MED ORDER — STERILE WATER FOR IRRIGATION IR SOLN
Status: DC | PRN
  Administered 2023-09-13: 1000 mL

## 2023-09-13 MED ORDER — CHLORHEXIDINE GLUCONATE 0.12 % MT SOLN
15.0000 mL | Freq: Once | OROMUCOSAL | Status: AC
  Administered 2023-09-13: 15 mL via OROMUCOSAL

## 2023-09-13 MED ORDER — ONDANSETRON HCL 4 MG/2ML IJ SOLN
INTRAMUSCULAR | Status: AC
  Filled 2023-09-13: qty 2

## 2023-09-13 MED ORDER — HEPARIN SODIUM (PORCINE) 5000 UNIT/ML IJ SOLN
5000.0000 [IU] | INTRAMUSCULAR | Status: AC
  Administered 2023-09-13: 5000 [IU] via SUBCUTANEOUS
  Filled 2023-09-13: qty 1

## 2023-09-13 MED ORDER — TRAMADOL HCL 50 MG PO TABS: 50.0000 mg | ORAL_TABLET | Freq: Four times a day (QID) | ORAL | Status: DC | PRN

## 2023-09-13 MED ORDER — ENOXAPARIN SODIUM 40 MG/0.4ML IJ SOSY
40.0000 mg | PREFILLED_SYRINGE | INTRAMUSCULAR | Status: DC
  Administered 2023-09-14: 40 mg via SUBCUTANEOUS
  Filled 2023-09-13: qty 0.4

## 2023-09-13 MED ORDER — ALBUTEROL SULFATE HFA 108 (90 BASE) MCG/ACT IN AERS: 1.0000 | INHALATION_SPRAY | RESPIRATORY_TRACT | Status: DC | PRN

## 2023-09-13 MED ORDER — FENTANYL CITRATE (PF) 100 MCG/2ML IJ SOLN
INTRAMUSCULAR | Status: AC
  Filled 2023-09-13: qty 2

## 2023-09-13 MED ORDER — POLYVINYL ALCOHOL 1.4 % OP SOLN: 1.0000 [drp] | Freq: Two times a day (BID) | OPHTHALMIC | Status: DC | PRN

## 2023-09-13 MED ORDER — ONDANSETRON HCL 4 MG/2ML IJ SOLN
INTRAMUSCULAR | Status: DC | PRN
  Administered 2023-09-13: 4 mg via INTRAVENOUS

## 2023-09-13 MED ORDER — FENTANYL CITRATE (PF) 100 MCG/2ML IJ SOLN
INTRAMUSCULAR | Status: DC | PRN
  Administered 2023-09-13 (×2): 25 ug via INTRAVENOUS

## 2023-09-13 MED ORDER — ACETAMINOPHEN 500 MG PO TABS: 1000.0000 mg | ORAL_TABLET | Freq: Once | ORAL | Status: DC

## 2023-09-13 MED ORDER — STERILE WATER FOR INJECTION IJ SOLN
INTRAMUSCULAR | Status: AC
  Filled 2023-09-13: qty 10

## 2023-09-13 MED ORDER — BUSPIRONE HCL 5 MG PO TABS
5.0000 mg | ORAL_TABLET | Freq: Every day | ORAL | Status: DC
  Administered 2023-09-14: 5 mg via ORAL
  Filled 2023-09-13: qty 1

## 2023-09-13 MED ORDER — LEVOTHYROXINE SODIUM 50 MCG PO TABS
50.0000 ug | ORAL_TABLET | Freq: Every day | ORAL | Status: DC
  Administered 2023-09-14: 50 ug via ORAL
  Filled 2023-09-13: qty 1

## 2023-09-13 MED ORDER — ROCURONIUM BROMIDE 10 MG/ML (PF) SYRINGE
PREFILLED_SYRINGE | INTRAVENOUS | Status: AC
  Filled 2023-09-13: qty 10

## 2023-09-13 MED ORDER — LACTATED RINGERS IR SOLN
Status: DC | PRN
  Administered 2023-09-13: 1000 mL

## 2023-09-13 MED ORDER — PANTOPRAZOLE SODIUM 40 MG PO TBEC
40.0000 mg | DELAYED_RELEASE_TABLET | Freq: Every day | ORAL | Status: DC
  Administered 2023-09-14: 40 mg via ORAL
  Filled 2023-09-13: qty 1

## 2023-09-13 MED ORDER — PHENYLEPHRINE 80 MCG/ML (10ML) SYRINGE FOR IV PUSH (FOR BLOOD PRESSURE SUPPORT)
PREFILLED_SYRINGE | INTRAVENOUS | Status: AC
  Filled 2023-09-13: qty 10

## 2023-09-13 MED ORDER — HEMOSTATIC AGENTS (NO CHARGE) OPTIME
TOPICAL | Status: DC | PRN
  Administered 2023-09-13: 1

## 2023-09-13 MED ORDER — HYDROMORPHONE HCL 1 MG/ML IJ SOLN: 0.2500 mg | INTRAMUSCULAR | Status: DC | PRN

## 2023-09-13 MED ORDER — KETAMINE HCL 50 MG/5ML IJ SOSY
PREFILLED_SYRINGE | INTRAMUSCULAR | Status: AC
  Filled 2023-09-13: qty 5

## 2023-09-13 SURGICAL SUPPLY — 68 items
APPLICATOR ARISTA FLEXITIP XL (MISCELLANEOUS) IMPLANT
APPLICATOR SURGIFLO ENDO (HEMOSTASIS) IMPLANT
BAG COUNTER SPONGE SURGICOUNT (BAG) IMPLANT
BAG LAPAROSCOPIC 12 15 PORT 16 (BASKET) IMPLANT
BLADE SURG SZ10 CARB STEEL (BLADE) IMPLANT
COVER BACK TABLE 60X90IN (DRAPES) ×2 IMPLANT
COVER TIP SHEARS 8 DVNC (MISCELLANEOUS) ×2 IMPLANT
DERMABOND ADVANCED .7 DNX12 (GAUZE/BANDAGES/DRESSINGS) ×2 IMPLANT
DRAPE ARM DVNC X/XI (DISPOSABLE) ×8 IMPLANT
DRAPE COLUMN DVNC XI (DISPOSABLE) ×2 IMPLANT
DRAPE SHEET LG 3/4 BI-LAMINATE (DRAPES) ×2 IMPLANT
DRAPE SURG IRRIG POUCH 19X23 (DRAPES) ×2 IMPLANT
DRIVER NDL MEGA SUTCUT DVNCXI (INSTRUMENTS) ×2 IMPLANT
DRIVER NDLE MEGA SUTCUT DVNCXI (INSTRUMENTS) ×1 IMPLANT
DRSG OPSITE POSTOP 4X6 (GAUZE/BANDAGES/DRESSINGS) IMPLANT
DRSG OPSITE POSTOP 4X8 (GAUZE/BANDAGES/DRESSINGS) IMPLANT
ELECT PENCIL ROCKER SW 15FT (MISCELLANEOUS) IMPLANT
ELECT REM PT RETURN 15FT ADLT (MISCELLANEOUS) ×2 IMPLANT
FORCEPS BPLR FENES DVNC XI (FORCEP) ×2 IMPLANT
FORCEPS PROGRASP DVNC XI (FORCEP) ×2 IMPLANT
GAUZE 4X4 16PLY ~~LOC~~+RFID DBL (SPONGE) ×4 IMPLANT
GLOVE BIO SURGEON STRL SZ 6 (GLOVE) ×8 IMPLANT
GLOVE BIO SURGEON STRL SZ 6.5 (GLOVE) ×2 IMPLANT
GOWN STRL REUS W/ TWL LRG LVL3 (GOWN DISPOSABLE) ×8 IMPLANT
GRASPER SUT TROCAR 14GX15 (MISCELLANEOUS) IMPLANT
HEMOSTAT ARISTA ABSORB 3G PWDR (HEMOSTASIS) IMPLANT
HOLDER FOLEY CATH W/STRAP (MISCELLANEOUS) IMPLANT
IRRIGATION SUCT STRKRFLW 2 WTP (MISCELLANEOUS) ×2 IMPLANT
KIT PROCEDURE DVNC SI (MISCELLANEOUS) IMPLANT
KIT TURNOVER KIT A (KITS) IMPLANT
LIGASURE IMPACT 36 18CM CVD LR (INSTRUMENTS) IMPLANT
MANIPULATOR ADVINCU DEL 3.0 PL (MISCELLANEOUS) IMPLANT
MANIPULATOR ADVINCU DEL 3.5 PL (MISCELLANEOUS) IMPLANT
MANIPULATOR UTERINE 4.5 ZUMI (MISCELLANEOUS) IMPLANT
NDL HYPO 21X1.5 SAFETY (NEEDLE) ×2 IMPLANT
NDL SPNL 18GX3.5 QUINCKE PK (NEEDLE) IMPLANT
NEEDLE HYPO 21X1.5 SAFETY (NEEDLE) ×1 IMPLANT
NEEDLE SPNL 18GX3.5 QUINCKE PK (NEEDLE) IMPLANT
OBTURATOR OPTICALSTD 8 DVNC (TROCAR) ×2 IMPLANT
PACK ROBOT GYN CUSTOM WL (TRAY / TRAY PROCEDURE) ×2 IMPLANT
PAD POSITIONING PINK XL (MISCELLANEOUS) ×2 IMPLANT
PORT ACCESS TROCAR AIRSEAL 12 (TROCAR) IMPLANT
SCISSORS MNPLR CVD DVNC XI (INSTRUMENTS) ×2 IMPLANT
SCRUB CHG 4% DYNA-HEX 4OZ (MISCELLANEOUS) IMPLANT
SEAL UNIV 5-12 XI (MISCELLANEOUS) ×8 IMPLANT
SET TRI-LUMEN FLTR TB AIRSEAL (TUBING) ×2 IMPLANT
SPIKE FLUID TRANSFER (MISCELLANEOUS) ×2 IMPLANT
SPONGE T-LAP 18X18 ~~LOC~~+RFID (SPONGE) IMPLANT
SURGIFLO W/THROMBIN 8M KIT (HEMOSTASIS) IMPLANT
SUT MNCRL AB 4-0 PS2 18 (SUTURE) IMPLANT
SUT PDS AB 1 TP1 96 (SUTURE) IMPLANT
SUT STRATA PDS 0 30 CT-2.5 (SUTURE) IMPLANT
SUT V-LOC 180 0-0 GS22 (SUTURE) IMPLANT
SUT VIC AB 0 CT1 27XBRD ANTBC (SUTURE) IMPLANT
SUT VIC AB 2-0 CT1 TAPERPNT 27 (SUTURE) IMPLANT
SUT VIC AB 2-0 SH 27X BRD (SUTURE) IMPLANT
SUT VIC AB 4-0 PS2 18 (SUTURE) ×4 IMPLANT
SUT VICRYL 0 27 CT2 27 ABS (SUTURE) ×2 IMPLANT
SUT VLOC 180 0 9IN GS21 (SUTURE) IMPLANT
SYR 10ML LL (SYRINGE) IMPLANT
SYSTEM BAG RETRIEVAL 10MM (BASKET) IMPLANT
SYSTEM WOUND ALEXIS 18CM MED (MISCELLANEOUS) IMPLANT
TRAP SPECIMEN MUCUS 40CC (MISCELLANEOUS) IMPLANT
TRAY FOLEY MTR SLVR 16FR STAT (SET/KITS/TRAYS/PACK) ×2 IMPLANT
TROCAR PORT AIRSEAL 5X120 (TROCAR) IMPLANT
UNDERPAD 30X36 HEAVY ABSORB (UNDERPADS AND DIAPERS) ×4 IMPLANT
WATER STERILE IRR 1000ML POUR (IV SOLUTION) ×2 IMPLANT
YANKAUER SUCT BULB TIP 10FT TU (MISCELLANEOUS) IMPLANT

## 2023-09-13 NOTE — Brief Op Note (Signed)
 09/13/2023  11:39 AM  PATIENT:  Gina Waller  89 y.o. female  PRE-OPERATIVE DIAGNOSIS:  ADNEXAL MASS,POST MENOPAUSAL BLEEDING,THICKEN ENDOMETRIUM  POST-OPERATIVE DIAGNOSIS:  ADNEXAL MASS,POST MENOPAUSAL BLEEDING,THICKEN ENDOMETRIUM  PROCEDURE:  Procedure(s) with comments: HYSTERECTOMY, TOTAL, ROBOT-ASSISTED, LAPAROSCOPIC, WITH BILATERAL SALPINGO-OOPHORECTOMY, STAGING WITH BIOPSIES (Bilateral) - STAGING  SURGEON:  Surgeons and Role:    * [name], MD - Primary  ASSISTANTS: Vira Grieves, NP   ANESTHESIA:   general  EBL:  50 mL   BLOOD ADMINISTERED:none  DRAINS: none   LOCAL MEDICATIONS USED:  MARCAINE      SPECIMEN:  uterus, cervix, adnexa, pelvic washings, anterior cul-de-sac adhesion, sigmoid mesentery peritoneum, omentum  DISPOSITION OF SPECIMEN:  PATHOLOGY  COUNTS:  YES  TOURNIQUET:  * No tourniquets in log *  DICTATION: .Note written in EPIC  PLAN OF CARE: Admit for overnight observation  PATIENT DISPOSITION:  PACU - hemodynamically stable.   Delay start of Pharmacological VTE agent (>24hrs) due to surgical blood loss or risk of bleeding: no

## 2023-09-13 NOTE — Interval H&P Note (Signed)
 History and Physical Interval Note:  09/13/2023 7:17 AM  Gina Waller  has presented today for surgery, with the diagnosis of ADNEXAL MASS,POST MENOPAUSAL BLEEDING,THICKEN ENDOMETRIUM.  The various methods of treatment have been discussed with the patient and family. After consideration of risks, benefits and other options for treatment, the patient has consented to  Procedure(s) with comments: HYSTERECTOMY, TOTAL, ROBOT-ASSISTED, LAPAROSCOPIC, WITH BILATERAL SALPINGO-OOPHORECTOMY (Bilateral) - POSS STAGING as a surgical intervention.  The patient's history has been reviewed, patient examined, no change in status, stable for surgery.  I have reviewed the patient's chart and labs.  Questions were answered to the patient's satisfaction.     [name]

## 2023-09-13 NOTE — Progress Notes (Signed)
   09/13/23 2212  BiPAP/CPAP/SIPAP  $ Face Mask Small Yes  BiPAP/CPAP/SIPAP Pt Type Adult  BiPAP/CPAP/SIPAP Resmed  Mask Type Nasal mask  Dentures removed? Not applicable  Mask Size Small  FiO2 (%) 28 %  Flow Rate 2 lpm  Patient Home Machine No  Patient Home Mask No  Patient Home Tubing No  Auto Titrate Yes (4-14)  Minimum cmH2O 4 cmH2O  Maximum cmH2O 14 cmH2O  Device Plugged into RED Power Outlet Yes

## 2023-09-13 NOTE — Anesthesia Procedure Notes (Signed)
 Procedure Name: Intubation Date/Time: 09/13/2023 8:52 AM  Performed by: Norvell Beers, CRNAPre-anesthesia Checklist: Patient identified, Emergency Drugs available, Suction available and Patient being monitored Patient Re-evaluated:Patient Re-evaluated prior to induction Oxygen Delivery Method: Circle system utilized Preoxygenation: Pre-oxygenation with 100% oxygen Induction Type: IV induction Ventilation: Mask ventilation without difficulty Laryngoscope Size: Mac and 3 Grade View: Grade I Tube type: Oral Tube size: 7.5 mm Airway Equipment and Method: Stylet Placement Confirmation: ETT inserted through vocal cords under direct vision, positive ETCO2 and breath sounds checked- equal and bilateral Secured at: 22 cm Tube secured with: Tape Dental Injury: Teeth and Oropharynx as per pre-operative assessment  Comments: Cords clear, atraumatic.

## 2023-09-13 NOTE — Transfer of Care (Signed)
 Immediate Anesthesia Transfer of Care Note  Patient: LEKEYSHA BENGE  Procedure(s) Performed: HYSTERECTOMY, TOTAL, ROBOT-ASSISTED, LAPAROSCOPIC, WITH BILATERAL SALPINGO-OOPHORECTOMY, STAGING WITH BIOPSIES (Bilateral: Pelvis)  Patient Location: PACU  Anesthesia Type:General  Level of Consciousness: drowsy  Airway & Oxygen Therapy: Patient Spontanous Breathing and Patient connected to face mask oxygen  Post-op Assessment: Report given to RN and Post -op Vital signs reviewed and stable  Post vital signs: Reviewed and stable  Last Vitals:  Vitals Value Taken Time  BP 122/68 09/13/23 1139  Temp    Pulse 60 09/13/23 1142  Resp 14 09/13/23 1142  SpO2 97 % 09/13/23 1142  Vitals shown include unfiled device data.  Last Pain:  Vitals:   09/13/23 0647  TempSrc: Oral  PainSc:          Complications: No notable events documented.

## 2023-09-13 NOTE — Op Note (Signed)
 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   Anesthesia: GET  Urine Output: 800 cc  Operative Findings: On EUA, small mobile uterus. On intra-abdominal entry, mildly enlarged gallbladder. Normal upper abdominal survey. Normal appearing omentum. Normal appearing small and large bowel. Uterus 8 cm, some adhesions between the bladder and cervix. Right ovary with 5-6 cm mass, adherent to the anterior cul de sac and right round ligament. Some powder burn lesions on the right pelvic sidewall, over the right round ligament, along left aspect of the sigmoid mesentery, right aspect of the rectal mesentery (all either fulgurated or excised). Normal appearing left tube and ovary. No ascites. No adenopathy. Some fibrosis of the retroperitoneum.  Frozen section consistent with granulosa cell tumor. Frozen section of endometrium most consistent with focal EIN. On cystoscopy, bladder dome intact, good efflux from bilateral ureteral orifices.   Estimated Blood Loss:  50 cc      Total IV Fluids: see I&O flowsheet         Specimens: uterus, cervix, bilateral tubes and ovaries, sigmoid mesenteric implant, anterior cul-de-sac adhesion, omental biopsy         Complications:  None apparent; patient tolerated the procedure well.         Disposition: PACU - hemodynamically stable.  Procedure Details   The patient was seen in the Holding Room. The risks, benefits, complications, treatment options, and expected outcomes were discussed with the patient.  The patient concurred with the proposed plan, giving informed consent.  The site of surgery properly noted/marked. The patient was identified as Gina Waller and the procedure verified as a Robotic-assisted hysterectomy with bilateral salpingo oophorectomy.   After induction of anesthesia, the patient was draped and prepped in the usual sterile manner. Patient was placed in supine position after anesthesia and draped and prepped in the usual sterile manner as follows: Her arms were tucked to her side with all appropriate precautions.  The patient was secured to the bed using padding and tape across her chest.  The patient was placed in the semi-lithotomy position in [place] stirrups.  The perineum and vagina were prepped with CHG. The patient's abdomen was prepped with ChloraPrep and then she was draped after the prep had been allowed to dry for 3 minutes.  A Time Out was held and the above information confirmed.  The urethra was prepped with Betadine . Foley catheter was placed.  A sterile speculum was placed in the vagina.  The cervix was grasped with a single-tooth tenaculum. The cervix was dilated with Adriana Hopping dilators.  The ZUMI uterine manipulator with a medium colpotomizer ring was placed without difficulty.  A pneum occluder balloon was placed over the manipulator.  OG tube placement was confirmed and to suction.   Next, a 5 mm skin incision was made 1 cm below the subcostal margin in the midclavicular line.  The 5 mm Optiview port and scope was used for direct entry.  Opening pressure was under 10 mm CO2.  The abdomen was insufflated and the findings were noted as above.   At  this point and all points during the procedure, the patient's intra-abdominal pressure did not exceed 15 mmHg. Next, an 8 mm skin incision was made superior to the umbilicus and  a right and left port were placed about 8 cm lateral to the robot port on the right and left side.  A fourth arm was placed on the right.  The 5 mm assist trocar was exchanged for a 5 mm airseal port. All ports were placed under direct visualization.  The patient was placed in steep Trendelenburg.  Bowel was folded away into the upper abdomen.  The robot was docked in the normal manner.  The right and left peritoneum were opened parallel to the IP ligament to open the retroperitoneal spaces bilaterally. The round ligaments were transected. The ureter was noted to be on the medial leaf of the broad ligament.  The peritoneum above the ureter was incised and stretched and the infundibulopelvic ligament was skeletonized, cauterized and cut.  On the right, the ovarian mass was dissected from surrounding right round ligament and right para-vesical adipose using a combination of blunt dissection and monopolar electrocautery. Once freed from the right pelvic sidewall, the utero-ovarian ligament was cauterized and transected, freeing the right adnexa.   The posterior peritoneum was taken down to the level of the KOH ring.  The anterior peritoneum was also taken down.  The bladder flap was created to the level of the KOH ring.  The uterine artery on the right side was skeletonized, cauterized and cut in the normal manner.  A similar procedure was performed on the left.  The colpotomy was made and the uterus, cervix, left adnexa were amputated and delivered through the vagina. The right adnexa was placed in an Endocatch bag and removed through the vagina. Pedicles were inspected and excellent hemostasis was achieved.    The colpotomy at the vaginal cuff was closed with 0 Vicryl with a figure of eight at each apex and 0 V-Loc to close the midportion of the cuff in a running manner.  Irrigation was used and excellent hemostasis was achieved.    Cystoscopy was performed after the bladder was back filled with 200 cc of  sterile fluid with findings noted above. Foley catheter was replaced.  At this point, frozen section returned. I asked the anesthesia team to draw an inhibin B. Several peritoneal adhesions/nodules were either excised or fulgurated using monopolar electrocautery. A small omental biopsy was taken using a combination of monopolar and bipolar electrocautery. This was then placed in an Endocatch bag and removed through the supraumbilical trocar.   Irrigation was again used with excellent hemostasis noted. Intra-abdominal pressure was decreased to 5 mm Hg with excellent hemostasis. Arista was then placed over all surgical beds. At this point in the procedure was completed.  Robotic instruments were removed under direct visulaization.  The robot was undocked. The fascia at the supraumbilical trocar was closed with 0 Vicryl using a PMI fascial closure device under direct visualization.  The subcuticular tissue was closed with 4-0 Vicryl and the skin was closed with 4-0 Monocryl in a subcuticular manner.  Dermabond was applied.    The vagina was swabbed with minimal bleeding noted. Foley catheter was left in situ.  All sponge, lap and needle counts were correct x 3.   The patient was transferred to the recovery room in stable condition.  Wiley Hanger, MD

## 2023-09-14 ENCOUNTER — Encounter (HOSPITAL_COMMUNITY): Payer: Self-pay | Admitting: Gynecologic Oncology

## 2023-09-14 LAB — CBC
HCT: 38.5 % (ref 36.0–46.0)
Hemoglobin: 12.3 g/dL (ref 12.0–15.0)
MCH: 28.8 pg (ref 26.0–34.0)
MCHC: 31.9 g/dL (ref 30.0–36.0)
MCV: 90.2 fL (ref 80.0–100.0)
Platelets: 248 10*3/uL (ref 150–400)
RBC: 4.27 MIL/uL (ref 3.87–5.11)
RDW: 14.5 % (ref 11.5–15.5)
WBC: 10.1 10*3/uL (ref 4.0–10.5)
nRBC: 0 % (ref 0.0–0.2)

## 2023-09-14 LAB — BASIC METABOLIC PANEL WITH GFR
Anion gap: 8 (ref 5–15)
BUN: 13 mg/dL (ref 8–23)
CO2: 29 mmol/L (ref 22–32)
Calcium: 8.9 mg/dL (ref 8.9–10.3)
Chloride: 100 mmol/L (ref 98–111)
Creatinine, Ser: 0.72 mg/dL (ref 0.44–1.00)
GFR, Estimated: >60 mL/min (ref >60)
Glucose, Bld: 135 mg/dL — ABNORMAL HIGH (ref 70–99)
Potassium: 3.1 mmol/L — ABNORMAL LOW (ref 3.5–5.1)
Sodium: 137 mmol/L (ref 135–145)

## 2023-09-14 LAB — CYTOLOGY - NON PAP

## 2023-09-14 LAB — SURGICAL PATHOLOGY

## 2023-09-14 MED ORDER — POTASSIUM CHLORIDE CRYS ER 20 MEQ PO TBCR
20.0000 meq | EXTENDED_RELEASE_TABLET | Freq: Two times a day (BID) | ORAL | Status: DC
  Administered 2023-09-14: 20 meq via ORAL
  Filled 2023-09-14: qty 1

## 2023-09-14 MED ORDER — POTASSIUM CHLORIDE CRYS ER 20 MEQ PO TBCR
20.0000 meq | EXTENDED_RELEASE_TABLET | Freq: Once | ORAL | Status: AC
  Administered 2023-09-14: 20 meq via ORAL
  Filled 2023-09-14: qty 1

## 2023-09-14 NOTE — Progress Notes (Signed)
   09/14/23 1112  TOC Brief Assessment  Insurance and Status Reviewed  Patient has primary care physician Yes  Home environment has been reviewed resides in private residence  Prior level of function: Independent  Prior/Current Home Services No current home services  Social Drivers of Health Review SDOH reviewed no interventions necessary  Readmission risk has been reviewed Yes

## 2023-09-14 NOTE — Discharge Summary (Signed)
 Physician Discharge Summary  Patient ID: Gina SCHMALZRIED MRN: [id number] DOB/AGE: [birth year] 88 y.o.  Admit date: 09/13/2023 Discharge date: 09/14/2023  Admission Diagnoses: Adnexal mass  Discharge Diagnoses:  Principal Problem:   Adnexal mass Active Problems:   Granulosa cell tumor of ovary, right   Ovarian mass   Discharged Condition:  The patient is in good condition and stable for discharge.    Hospital Course: On 09/13/2023, the patient underwent the following: Procedure(s): HYSTERECTOMY, TOTAL, ROBOT-ASSISTED, LAPAROSCOPIC, WITH BILATERAL SALPINGO-OOPHORECTOMY, STAGING WITH BIOPSIES. The postoperative course was uneventful.  She was discharged to home on postoperative day 1 tolerating a regular diet, pain minimal. Prior to discharge the patient was ambulated with assist and had the foley catheter removed. Operative findings discussed with patient by Dr. Orvil Bland this am.  Consults: None  Significant Diagnostic Studies: Labs  Treatments: Surgery: see above  Discharge Exam (performed by Dr. Orvil Bland): Blood pressure 108/60, pulse 66, temperature 98.4 F (36.9 C), temperature source Oral, resp. rate 18, height 5\' 3"  (1.6 m), weight 162 lb 6.4 oz (73.7 kg), SpO2 91%. General appearance: alert, cooperative, and no distress Resp: clear to auscultation bilaterally Cardio: regular rate and rhythm, S1, S2 normal, no murmur, click, rub or gallop GI: abdomen soft, active bowel sounds Extremities: extremities normal, atraumatic, no cyanosis or edema Incision/Wound: lap sites to the abdomen with dermabond are intact without active drainage  Disposition: Discharge disposition: 01-Home or Self Care       Discharge Instructions     Call MD for:  difficulty breathing, headache or visual disturbances   Complete by: As directed    Call MD for:  extreme fatigue   Complete by: As directed    Call MD for:  hives   Complete by: As directed    Call MD for:  persistant dizziness or  light-headedness   Complete by: As directed    Call MD for:  persistant nausea and vomiting   Complete by: As directed    Call MD for:  redness, tenderness, or signs of infection (pain, swelling, redness, odor or green/yellow discharge around incision site)   Complete by: As directed    Call MD for:  severe uncontrolled pain   Complete by: As directed    Call MD for:  temperature >100.4   Complete by: As directed    Diet - low sodium heart healthy   Complete by: As directed    Driving Restrictions   Complete by: As directed    No driving for 8-65 days until the following criteria have been met and if you were cleared to drive before surgery.  Do not take narcotics and drive. You need to make sure your reaction time has returned.   Increase activity slowly   Complete by: As directed    Lifting restrictions   Complete by: As directed    No lifting greater than 10 lbs, pushing, pulling, straining for 6 weeks.   Sexual Activity Restrictions   Complete by: As directed    No sexual activity, nothing in the vagina, for 10-12 weeks.      Allergies as of 09/14/2023       Reactions   Ace Inhibitors Cough   Amlodipine     Other Reaction(s): Unknown   Cephalexin    Other Reaction(s): dizziness   Etodolac    stiffened arms   Lemonade Flavor [flavoring Agent (non-screening)]    sneezing        Medication List     PAUSE taking these  medications    apixaban 5 MG Tabs tablet Wait to take this until: Sep 21, 2023 Commonly known as: ELIQUIS Take 5 mg by mouth.   estradiol 0.1 MG/GM vaginal cream Wait to take this until: December 07, 2023 Commonly known as: ESTRACE Place 1 Applicatorful vaginally once a week.       TAKE these medications    albuterol 108 (90 Base) MCG/ACT inhaler Commonly known as: VENTOLIN HFA Inhale 1 puff into the lungs every 4 (four) hours as needed for wheezing or shortness of breath.   amLODipine  5 MG tablet Commonly known as: NORVASC  Take 5 mg by mouth  daily.   busPIRone 5 MG tablet Commonly known as: BUSPAR Take 5 mg by mouth daily.   carboxymethylcellulose 1 % ophthalmic solution Place 1 drop into both eyes 2 (two) times daily as needed (dry eyes).   cholecalciferol 1000 units tablet Commonly known as: VITAMIN D Take 1,000 Units by mouth daily.   cyanocobalamin 1000 MCG tablet Commonly known as: VITAMIN B12 Take 1,000 mcg by mouth daily.   furosemide 40 MG tablet Commonly known as: LASIX Take 40 mg by mouth daily.   levothyroxine  50 MCG tablet Commonly known as: SYNTHROID  Take 50 mcg by mouth daily before breakfast.   metoprolol  succinate 50 MG 24 hr tablet Commonly known as: TOPROL -XL Take 50 mg by mouth daily. Take with or immediately following a meal.   NON FORMULARY CPAP, USE NIGHTLY FOR SLEEP APNEA   pantoprazole  40 MG tablet Commonly known as: PROTONIX  Take 40 mg by mouth daily.   potassium chloride  SA 20 MEQ tablet Commonly known as: KLOR-CON  M Take 1 tablet (20 mEq total) by mouth 2 (two) times daily.   predniSONE  20 MG tablet Commonly known as: DELTASONE  Take 2 tablets (40 mg total) by mouth daily.   rosuvastatin  20 MG tablet Commonly known as: CRESTOR  Take 20 mg by mouth at bedtime.   senna-docusate 8.6-50 MG tablet Commonly known as: Senokot-S Take 2 tablets by mouth at bedtime. For AFTER surgery, do not take if having diarrhea   traMADol  50 MG tablet Commonly known as: ULTRAM  Take 1 tablet (50 mg total) by mouth every 6 (six) hours as needed for severe pain (pain score 7-10). For AFTER surgery, do not take and drive        Follow-up Information     [name], MD Follow up on 10/13/2023.   Specialty: Gynecologic Oncology Why: at 4pm at the [facility] for post-op check Contact information: [address] [place] [address] [phone]                 Greater than thirty minutes were spend for face to face discharge instructions and discharge orders/summary  in EPIC.   Signed: Suellyn Emory 09/14/2023, 10:49 AM

## 2023-09-14 NOTE — Plan of Care (Signed)

## 2023-09-14 NOTE — Anesthesia Postprocedure Evaluation (Signed)
 Anesthesia Post Note  Patient: Gina Waller  Procedure(s) Performed: HYSTERECTOMY, TOTAL, ROBOT-ASSISTED, LAPAROSCOPIC, WITH BILATERAL SALPINGO-OOPHORECTOMY, STAGING WITH BIOPSIES (Bilateral: Pelvis)     Patient location during evaluation: PACU Anesthesia Type: General Level of consciousness: sedated and patient cooperative Pain management: pain level controlled Vital Signs Assessment: post-procedure vital signs reviewed and stable Respiratory status: spontaneous breathing Cardiovascular status: stable Anesthetic complications: no   No notable events documented.  Last Vitals:  Vitals:   09/14/23 0224 09/14/23 0533  BP: 129/72 127/73  Pulse: 70 70  Resp: 17 18  Temp: 36.5 C 36.6 C  SpO2: 96% 93%    Last Pain:  Vitals:   09/14/23 0533  TempSrc: Oral  PainSc:                  Gorman Laughter

## 2023-09-15 ENCOUNTER — Telehealth: Payer: Self-pay | Admitting: *Deleted

## 2023-09-15 NOTE — Telephone Encounter (Signed)
 Spoke with Ms. Vanderwalker and her niece Joella Musa this morning. She states she is eating, drinking and urinating well. She has had a BM and is passing gas. She is taking senokot as prescribed and encouraged her to drink plenty of water . She denies fever or chills. Incisions are dry and intact. She rates her soreness only 1/10. It's controlled with tylenol .    Reiterated to patient and Joella Musa that pt is to start back up on Eliquis 5mg  twice daily on Thursday, May 15 th if no signs of bleeding. And hold estradiol vaginal cream until July 31 st. Patient and niece both verbalized understanding.    Instructed to call office with any fever, chills, purulent drainage, uncontrolled pain or any other questions or concerns. Patient verbalizes understanding.   Pt aware of post op appointments as well as the office number [phone] and after hours number [phone] to call if she has any questions or concerns

## 2023-09-18 DIAGNOSIS — L400 Psoriasis vulgaris: Secondary | ICD-10-CM | POA: Diagnosis not present

## 2023-09-19 LAB — INHIBIN B: Inhibin B: 568.4 pg/mL — ABNORMAL HIGH (ref 0.0–16.9)

## 2023-09-22 ENCOUNTER — Telehealth: Payer: Self-pay | Admitting: Gynecologic Oncology

## 2023-09-22 NOTE — Telephone Encounter (Signed)
 Called patient to check-in after surgery and to discuss pathology results.  No answer.  Left voicemail requesting callback.  Wiley Hanger MD Gynecologic Oncology

## 2023-09-25 ENCOUNTER — Other Ambulatory Visit: Payer: Self-pay | Admitting: Oncology

## 2023-09-25 NOTE — Progress Notes (Signed)
 Gynecologic Oncology Multi-Disciplinary Disposition Conference Note  Date of the Conference: 09/25/2023  Patient Name: Gina Waller  Referring Provider: Dr. Asencion Blacksmith Primary GYN Oncologist: Dr. Orvil Bland   Stage/Disposition:  Stage IA granulosa cell tumor of the right ovary. Disposition is to close observation.   This Multidisciplinary conference took place involving physicians from Gynecologic Oncology, Medical Oncology, Radiation Oncology, Pathology, Radiology along with the Gynecologic Oncology Nurse Practitioner and Gynecologic Oncology Nurse Navigator.  Comprehensive assessment of the patient's malignancy, staging, need for surgery, chemotherapy, radiation therapy, and need for further testing were reviewed. Supportive measures, both inpatient and following discharge were also discussed. The recommended plan of care is documented. Greater than 35 minutes were spent correlating and coordinating this patient's care.

## 2023-10-09 DIAGNOSIS — I480 Paroxysmal atrial fibrillation: Secondary | ICD-10-CM | POA: Diagnosis not present

## 2023-10-09 DIAGNOSIS — R0600 Dyspnea, unspecified: Secondary | ICD-10-CM | POA: Diagnosis not present

## 2023-10-09 DIAGNOSIS — G4733 Obstructive sleep apnea (adult) (pediatric): Secondary | ICD-10-CM | POA: Diagnosis not present

## 2023-10-13 ENCOUNTER — Encounter: Payer: Self-pay | Admitting: Gynecologic Oncology

## 2023-10-13 ENCOUNTER — Inpatient Hospital Stay: Attending: Gynecologic Oncology | Admitting: Gynecologic Oncology

## 2023-10-13 VITALS — BP 134/68 | HR 65 | Temp 98.7°F | Resp 18 | Wt 167.4 lb

## 2023-10-13 DIAGNOSIS — D3911 Neoplasm of uncertain behavior of right ovary: Secondary | ICD-10-CM

## 2023-10-13 DIAGNOSIS — N8502 Endometrial intraepithelial neoplasia [EIN]: Secondary | ICD-10-CM

## 2023-10-13 DIAGNOSIS — Z7189 Other specified counseling: Secondary | ICD-10-CM

## 2023-10-13 DIAGNOSIS — N9489 Other specified conditions associated with female genital organs and menstrual cycle: Secondary | ICD-10-CM

## 2023-10-13 NOTE — Progress Notes (Signed)
 Gynecologic Oncology Return Clinic Visit  10/13/23  Reason for Visit: follow-up  Treatment History: Pelvic ultrasound exam on 04/10/2023: Uterus measures 9.2 x 4.4 x 5.9 cm.  Nabothian cyst seen within the cervix.  Endometrium measures 9.2 mm.  Right ovary not visualized however there is a right adnexal mass measuring 4.8 x 4.7 x 3.7 cm.  Left ovary not visualized. CT of the pelvis on 04/19/2023: 5.2 cm rounded mass in the right adnexa along the right anterior margin of the uterus, isodense of the uterine parenchyma, most consistent with pedunculated subserosal uterine fibroid.  Persistent endometrial thickening, similar to recent ultrasound.  Hypodense masslike area within the cervix, could reflect nabothian cyst versus cervical mass.  Sigmoid diverticulosis noted.  No free fluid, no adenopathy. Patient initially seen on 12/20 for postmenopausal bleeding and right ovarian mass.  Pelvic ultrasound showed 4.8 cm solid right adnexal mass without ascites, CT suggestive of a pedunculated fibroid.  Patient endorsed vaginal spotting at that time.  Has a history of a benign polyp removed by Dr. Shelah Derry 12 years previously. Endometrial biopsy on 04/28/2023: Fragments of benign endometrium, no hyperplasia, atypia, or malignancy. CA125 on 04/29/2023: 33.4 MRI of the pelvis on 07/24/2023: Complex right adnexal lesion measuring 6.2 x 5.9 x 5.6 cm with thickened septations.  Thick enhancing margin and some internal nodularity along septations noted.  Appearance not typical for fibroid.  Thickened junctional zone within the uterus measuring 1.6 cm suggestive of adenomyosis.  Likely small, less than 1 cm fundal fibroid.  Sigmoid colon diverticulosis also noted.  No ascites.  No adenopathy.  09/13/23: Robotic-assisted laparoscopic total hysterectomy with bilateral salpingo-oophorectomy, peritoneal biopsies, fulguration and excision of peritoneal implants/adhesions, omental biopsy, cystoscopy  Inhibin on 5/7: 568.4, drawn  at the time of frozen section results  Interval History: Doing well.  Denies any abdominal or pelvic pain.  Reports some mild different sensation in her bladder after she voids in the night, goes away with position changes.  Reports good bowel function, taking senna still with daily bowel movements.  Denies any vaginal bleeding.  Past Medical/Surgical History: Past Medical History:  Diagnosis Date   Acute otitis externa of right ear 09/06/2023   Adenomatous colon polyp    Angioma of skin 07/07/2023   Anxiety    Atypical chest pain 04/27/2023   Basal cell carcinoma of chest 2021   Basal cell carcinoma of right shoulder 07/07/2023   Cataract 04/27/2023   Chronic respiratory failure with hypoxia (HCC) 06/20/2023   Constipation 09/06/2023   Cough 11/04/2019   DDD (degenerative disc disease), cervical 04/27/2023   Disequilibrium 10/04/2017   Disorder of bone 03/09/2009   Disorder of skin or subcutaneous tissue 10/04/2017   Dysrhythmia    Afib   First degree heart block 04/27/2023   GERD (gastroesophageal reflux disease)    Hemorrhoids    Hiatal hernia 04/27/2023   High cholesterol    History of actinic keratosis 07/07/2023   Hypercalcemia 04/27/2023   Hyperlipidemia 04/27/2023   Hypertension    Hypothyroidism    Inflamed seborrheic keratosis 07/07/2023   Insomnia 02/28/2018   Lack of coordination 10/04/2017   Lentigo 07/07/2023   Macular degeneration 04/27/2023   Getting ocular injections on the left eye.     Milia 07/07/2023   Neck pain 03/18/2019   Neoplasm of uncertain behavior of skin 07/07/2023   Neurogenic claudication 09/06/2023   Nevus of right foot 07/07/2023   Obesity 02/16/2011   Osteoarthritis    Osteopenia 04/27/2023   Overactive bladder  04/27/2023   Paroxysmal atrial fibrillation (HCC) 06/20/2023   Pneumonia 05/2023   Polyp of colon 04/27/2023   Primary osteoarthritis of right knee 01/10/2014   Psoriasis 07/07/2023   Rheumatoid arthritis (HCC)  04/27/2023   Right ear pain 09/06/2023   S/P total knee arthroplasty, right 03/09/2020   Seasonal allergic rhinitis 03/09/2009   Seborrheic keratoses 07/07/2023   Sleep apnea    remote diagnosis, no CPAP   Spinal stenosis of lumbar region 04/27/2023   Sun-damaged skin 07/07/2023   Transient ischemic attack 07/07/2009   Vaginal bleeding 09/06/2023    Past Surgical History:  Procedure Laterality Date   BREAST REDUCTION SURGERY     At age 3   DILATION AND CURETTAGE OF UTERUS  2012   to remove uterine polyps   ROBOTIC ASSISTED TOTAL HYSTERECTOMY WITH BILATERAL SALPINGO OOPHERECTOMY Bilateral 09/13/2023   Procedure: HYSTERECTOMY, TOTAL, ROBOT-ASSISTED, LAPAROSCOPIC, WITH BILATERAL SALPINGO-OOPHORECTOMY, STAGING WITH BIOPSIES;  Surgeon: [name], MD;  Location: [facility];  Service: Gynecology;  Laterality: Bilateral;  STAGING   TONSILLECTOMY     TOTAL KNEE ARTHROPLASTY Left    TOTAL KNEE ARTHROPLASTY Right 03/09/2020   Procedure: TOTAL KNEE ARTHROPLASTY;  Surgeon: Christie Cox, MD;  Location: [facility];  Service: Orthopedics;  Laterality: Right;    Family History  Problem Relation Age of Onset   Colon cancer Father    Cancer Sister        colon?   Breast cancer Niece     Social History   Socioeconomic History   Marital status: Widowed    Spouse name: Not on file   Number of children: Not on file   Years of education: Not on file   Highest education level: Not on file  Occupational History   Not on file  Tobacco Use   Smoking status: Never   Smokeless tobacco: Never  Vaping Use   Vaping status: Never Used  Substance and Sexual Activity   Alcohol  use: No   Drug use: No   Sexual activity: Not Currently    Birth control/protection: Post-menopausal  Other Topics Concern   Not on file  Social History Narrative   Not on file   Social Drivers of Health   Financial Resource Strain: Not on file  Food Insecurity: No Food Insecurity (09/13/2023)   Hunger Vital Sign     Worried About Running Out of Food in the Last Year: Never true    Ran Out of Food in the Last Year: Never true  Transportation Needs: No Transportation Needs (09/13/2023)   PRAPARE - Administrator, Civil Service (Medical): No    Lack of Transportation (Non-Medical): No  Physical Activity: Not on file  Stress: Not on file  Social Connections: Unknown (09/13/2023)   Social Connection and Isolation Panel [NHANES]    Frequency of Communication with Friends and Family: More than three times a week    Frequency of Social Gatherings with Friends and Family: More than three times a week    Attends Religious Services: Patient declined    Database administrator or Organizations: Patient declined    Attends Banker Meetings: Patient declined    Marital Status: Widowed    Current Medications:  Current Outpatient Medications:    albuterol  (VENTOLIN  HFA) 108 (90 Base) MCG/ACT inhaler, Inhale 1 puff into the lungs every 4 (four) hours as needed for wheezing or shortness of breath., Disp: , Rfl:    amLODipine  (NORVASC ) 5 MG tablet, Take 5 mg  by mouth daily.  , Disp: , Rfl:    apixaban (ELIQUIS) 5 MG TABS tablet, Take 5 mg by mouth., Disp: , Rfl:    busPIRone  (BUSPAR ) 5 MG tablet, Take 5 mg by mouth daily., Disp: , Rfl:    carboxymethylcellulose 1 % ophthalmic solution, Place 1 drop into both eyes 2 (two) times daily as needed (dry eyes)., Disp: , Rfl:    cholecalciferol (VITAMIN D) 1000 UNITS tablet, Take 1,000 Units by mouth daily.  , Disp: , Rfl:    cyanocobalamin (VITAMIN B12) 1000 MCG tablet, Take 1,000 mcg by mouth daily., Disp: , Rfl:    furosemide (LASIX) 40 MG tablet, Take 40 mg by mouth daily., Disp: , Rfl:    levothyroxine  (SYNTHROID , LEVOTHROID) 50 MCG tablet, Take 50 mcg by mouth daily before breakfast., Disp: , Rfl:    metoprolol  succinate (TOPROL -XL) 50 MG 24 hr tablet, Take 50 mg by mouth daily. Take with or immediately following a meal., Disp: , Rfl:    NON  FORMULARY, CPAP, USE NIGHTLY FOR SLEEP APNEA, Disp: , Rfl:    pantoprazole  (PROTONIX ) 40 MG tablet, Take 40 mg by mouth daily.  , Disp: , Rfl:    potassium chloride  SA (KLOR-CON  M) 20 MEQ tablet, Take 1 tablet (20 mEq total) by mouth 2 (two) times daily., Disp: 6 tablet, Rfl: 0   rosuvastatin  (CRESTOR ) 20 MG tablet, Take 20 mg by mouth at bedtime., Disp: , Rfl:    [Paused] estradiol (ESTRACE) 0.1 MG/GM vaginal cream, Place 1 Applicatorful vaginally once a week., Disp: , Rfl:   Review of Systems: Denies appetite changes, fevers, chills, fatigue, unexplained weight changes. Denies hearing loss, neck lumps or masses, mouth sores, ringing in ears or voice changes. Denies cough or wheezing.  Denies shortness of breath. Denies chest pain or palpitations. Denies leg swelling. Denies abdominal distention, pain, blood in stools, constipation, diarrhea, nausea, vomiting, or early satiety. Denies pain with intercourse, dysuria, frequency, hematuria or incontinence. Denies hot flashes, pelvic pain, vaginal bleeding or vaginal discharge.   Denies joint pain, back pain or muscle pain/cramps. Denies itching, rash, or wounds. Denies dizziness, headaches, numbness or seizures. Denies swollen lymph nodes or glands, denies easy bruising or bleeding. Denies anxiety, depression, confusion, or decreased concentration.  Physical Exam: BP 134/68 Comment: manual recheck, MD notified  Pulse 65   Temp 98.7 F (37.1 C)   Resp 18   Wt 167 lb 6.4 oz (75.9 kg)   SpO2 94%   BMI 29.65 kg/m  General: Alert, oriented, no acute distress. HEENT: Posterior oropharynx clear, sclera anicteric. Chest: Unlabored breathing on room air. Abdomen: soft, nontender.  Normoactive bowel sounds.  No masses or hepatosplenomegaly appreciated.  Well-healed incisions. Extremities: Grossly normal range of motion.  Warm, well perfused.  No edema bilaterally. GU: Normal appearing external genitalia without erythema, excoriation, or  lesions.  Speculum exam reveals cuff intact, suture visible, no bleeding or discharge.  Bimanual exam reveals intact, no fluctuance or tenderness to palpation.    Laboratory & Radiologic Studies: A. FALLOPIAN TUBE AND OVARY, RIGHT, HYSTERECTOMY: - Granulosa cell tumor, adult type, 6.8 cm, with focal infarction - Segment of benign unremarkable fallopian tube  B. UTERUS, CERVIX, FALLIOPIAN TUBE AND OVARY, LEFT, HYSTERECTOMY - Endometrial intraepithelial neoplasia (EIN), involving an endometrial polyp, 1.5 cm - EIN focally involves foci of adenomyosis but definite myometrial invasion is not identified - Benign unremarkable cervix - Benign unremarkable left fallopian tube and ovary  C.  INTERIOR CUL-DE-SAC - Fibroconnective tissue with congestion  and hemosiderin-containing macrophages - Negative for tumor  D.  SIGMOID MESENTERY - Benign fibroadipose tissue, negative for tumor  E.  OMENTUM, BIOPSY: - Portion of omentum, negative for carcinom   Assessment & Plan: Gina Waller is a 89 y.o. 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  Patient is doing very well postoperatively.  Discussed continued expectations and restrictions.  The patient's niece joined by phone and another in person.  We discussed that typically this is not a cancer caused by genetic mutation although I am happy to refer the patient to speak with one of our genetic counselors.  There is family history of breast cancer in the family and patient's niece, Joella Musa, states that she had BRCA testing previously that was negative.  The patient declines referral to genetics.  Reviewed findings from surgery as well as her pathology.  She was given a copy of her pathology report.  In the setting of early-stage granulosa cell tumor, discussed no adjuvant treatment.  Inhibin B was a tumor marker for her and we will plan to check this at follow-up visits.  In the setting of early-stage disease, plan on  follow-up visits every 6 months.  I reviewed signs and symptoms that should prompt a phone call between scheduled follow-up.  28 minutes of total time was spent for this patient encounter, including preparation, face-to-face counseling with the patient and coordination of care, and documentation of the encounter.  Wiley Hanger, MD  Division of Gynecologic Oncology  Department of Obstetrics and Gynecology  [facility]

## 2023-10-13 NOTE — Patient Instructions (Signed)
 It was good to see you.  You are healing very well from surgery!  Please remember, no heavy lifting for 6 weeks after surgery and nothing in the vagina for 12 weeks.  I will plan to see you in 6 months for a follow-up visit.  Please call if you develop any of the symptoms that we discussed today before your next scheduled visit.

## 2023-10-26 DIAGNOSIS — M205X1 Other deformities of toe(s) (acquired), right foot: Secondary | ICD-10-CM | POA: Diagnosis not present

## 2023-10-26 DIAGNOSIS — M79674 Pain in right toe(s): Secondary | ICD-10-CM | POA: Diagnosis not present

## 2023-11-08 DIAGNOSIS — L821 Other seborrheic keratosis: Secondary | ICD-10-CM | POA: Diagnosis not present

## 2023-11-14 DIAGNOSIS — H35033 Hypertensive retinopathy, bilateral: Secondary | ICD-10-CM | POA: Diagnosis not present

## 2023-11-14 DIAGNOSIS — Z961 Presence of intraocular lens: Secondary | ICD-10-CM | POA: Diagnosis not present

## 2023-11-14 DIAGNOSIS — H353231 Exudative age-related macular degeneration, bilateral, with active choroidal neovascularization: Secondary | ICD-10-CM | POA: Diagnosis not present

## 2023-11-14 DIAGNOSIS — H43813 Vitreous degeneration, bilateral: Secondary | ICD-10-CM | POA: Diagnosis not present

## 2023-11-23 DIAGNOSIS — J189 Pneumonia, unspecified organism: Secondary | ICD-10-CM | POA: Diagnosis not present

## 2023-11-24 DIAGNOSIS — G4733 Obstructive sleep apnea (adult) (pediatric): Secondary | ICD-10-CM | POA: Diagnosis not present

## 2023-11-28 ENCOUNTER — Encounter (HOSPITAL_BASED_OUTPATIENT_CLINIC_OR_DEPARTMENT_OTHER): Payer: Self-pay

## 2023-11-28 ENCOUNTER — Ambulatory Visit (HOSPITAL_BASED_OUTPATIENT_CLINIC_OR_DEPARTMENT_OTHER)
Admission: EM | Admit: 2023-11-28 | Discharge: 2023-11-28 | Disposition: A | Discharge status: Home / Self Care | Attending: Family Medicine | Admitting: Family Medicine

## 2023-11-28 ENCOUNTER — Ambulatory Visit (INDEPENDENT_AMBULATORY_CARE_PROVIDER_SITE_OTHER)
Admit: 2023-11-28 | Discharge: 2023-11-28 | Disposition: A | Discharge status: Home / Self Care | Attending: Family Medicine | Admitting: Family Medicine

## 2023-11-28 ENCOUNTER — Other Ambulatory Visit (HOSPITAL_BASED_OUTPATIENT_CLINIC_OR_DEPARTMENT_OTHER): Payer: Self-pay

## 2023-11-28 DIAGNOSIS — M79642 Pain in left hand: Secondary | ICD-10-CM | POA: Diagnosis not present

## 2023-11-28 DIAGNOSIS — D485 Neoplasm of uncertain behavior of skin: Secondary | ICD-10-CM | POA: Diagnosis not present

## 2023-11-28 DIAGNOSIS — M25532 Pain in left wrist: Secondary | ICD-10-CM | POA: Diagnosis not present

## 2023-11-28 DIAGNOSIS — M7989 Other specified soft tissue disorders: Secondary | ICD-10-CM

## 2023-11-28 DIAGNOSIS — M25432 Effusion, left wrist: Secondary | ICD-10-CM

## 2023-11-28 DIAGNOSIS — M79632 Pain in left forearm: Secondary | ICD-10-CM | POA: Diagnosis not present

## 2023-11-28 DIAGNOSIS — M1812 Unilateral primary osteoarthritis of first carpometacarpal joint, left hand: Secondary | ICD-10-CM | POA: Diagnosis not present

## 2023-11-28 MED ORDER — PREDNISONE 20 MG PO TABS
ORAL_TABLET | ORAL | 0 refills | Status: AC
  Filled 2023-11-28: qty 9, 6d supply, fill #0

## 2023-11-28 NOTE — Discharge Instructions (Addendum)
 Left forearm, wrist, hand swelling and pain with changes on x-ray did indicate gout: X-rays have been read and shows significant arthritic changes and some deposits that are consistent with gouty arthritis.  Lab work is pending.  Will adjust the plan of care, if needed once the labs result.  Given a forearm splint.  Patient would like sling for support but wear out.  I will give information to prevail about getting her a sling.  Prednisone  20 mg, #2 pills (40 mg dose) daily x 3 days and then take 20 mg, #1 pill (20 mg dose) daily x 3 days.  Follow-up with family practice for management of gout.  Return here as needed.

## 2023-11-28 NOTE — ED Triage Notes (Addendum)
 Pt c/o left wrist pain that started 4-5 days ago. Pt denies known injury to the area. 2-3 days ago her hang began to swell as well. She has taken tylenol  for the pain and it has helped. She has also been applying ice and wearing a wrist brace and that also seems to help.

## 2023-11-28 NOTE — ED Provider Notes (Addendum)
 [facility]    CSN: [id number] Arrival date & time: 11/28/23  1125      History   Chief Complaint Chief Complaint  Patient presents with   Wrist Pain    HPI KRISTELL WOODING is a 89 y.o. female.   89 year old female with report of acute onset left hand pain and swelling.  She actually has pain at the left forearm, the left wrist and the left hand.  She denies any injury or fall.  Hand swelling is so significant that she has trouble moving her fingers.  She does have psoriatic arthritis.  Tylenol  helped her pain some.  She has been applying ice and has used a wrist splint and both have helped.   Wrist Pain Pertinent negatives include no chest pain and no abdominal pain.    Past Medical History:  Diagnosis Date   Acute otitis externa of right ear 09/06/2023   Adenomatous colon polyp    Angioma of skin 07/07/2023   Anxiety    Atypical chest pain 04/27/2023   Basal cell carcinoma of chest 2021   Basal cell carcinoma of right shoulder 07/07/2023   Cataract 04/27/2023   Chronic respiratory failure with hypoxia (HCC) 06/20/2023   Constipation 09/06/2023   Cough 11/04/2019   DDD (degenerative disc disease), cervical 04/27/2023   Disequilibrium 10/04/2017   Disorder of bone 03/09/2009   Disorder of skin or subcutaneous tissue 10/04/2017   Dysrhythmia    Afib   First degree heart block 04/27/2023   GERD (gastroesophageal reflux disease)    Hemorrhoids    Hiatal hernia 04/27/2023   High cholesterol    History of actinic keratosis 07/07/2023   Hypercalcemia 04/27/2023   Hyperlipidemia 04/27/2023   Hypertension    Hypothyroidism    Inflamed seborrheic keratosis 07/07/2023   Insomnia 02/28/2018   Lack of coordination 10/04/2017   Lentigo 07/07/2023   Macular degeneration 04/27/2023   Getting ocular injections on the left eye.     Milia 07/07/2023   Neck pain 03/18/2019   Neoplasm of uncertain behavior of skin 07/07/2023   Neurogenic claudication 09/06/2023    Nevus of right foot 07/07/2023   Obesity 02/16/2011   Osteoarthritis    Osteopenia 04/27/2023   Overactive bladder 04/27/2023   Paroxysmal atrial fibrillation (HCC) 06/20/2023   Pneumonia 05/2023   Polyp of colon 04/27/2023   Primary osteoarthritis of right knee 01/10/2014   Psoriasis 07/07/2023   Rheumatoid arthritis (HCC) 04/27/2023   Right ear pain 09/06/2023   S/P total knee arthroplasty, right 03/09/2020   Seasonal allergic rhinitis 03/09/2009   Seborrheic keratoses 07/07/2023   Sleep apnea    remote diagnosis, no CPAP   Spinal stenosis of lumbar region 04/27/2023   Sun-damaged skin 07/07/2023   Transient ischemic attack 07/07/2009   Vaginal bleeding 09/06/2023    Patient Active Problem List   Diagnosis Date Noted   Adnexal mass 09/13/2023   Granulosa cell tumor of ovary, right 09/13/2023   Ovarian mass 09/13/2023   Acute otitis externa of right ear 09/06/2023   Constipation 09/06/2023   Neurogenic claudication 09/06/2023   Right ear pain 09/06/2023   Vaginal bleeding 09/06/2023   Adenomatous colon polyp    Anxiety    Dysrhythmia    GERD (gastroesophageal reflux disease)    Osteoarthritis    Pneumonia    Basal cell carcinoma of right shoulder 07/07/2023   Psoriasis 07/07/2023   Angioma of skin 07/07/2023   History of actinic keratosis 07/07/2023   Seborrheic keratoses  07/07/2023   Inflamed seborrheic keratosis 07/07/2023   Lentigo 07/07/2023   Neoplasm of uncertain behavior of skin 07/07/2023   Milia 07/07/2023   Nevus of right foot 07/07/2023   Sun-damaged skin 07/07/2023   Chronic respiratory failure with hypoxia (HCC) 06/20/2023   Paroxysmal atrial fibrillation (HCC) 06/20/2023   First degree heart block 04/27/2023   Cataract 04/27/2023   Atypical chest pain 04/27/2023   Overactive bladder 04/27/2023   DDD (degenerative disc disease), cervical 04/27/2023   Hemorrhoids 04/27/2023   Hiatal hernia 04/27/2023   Hypercalcemia 04/27/2023    Hyperlipidemia 04/27/2023   Hypertensive disorder 04/27/2023   Hypothyroidism 04/27/2023   Macular degeneration 04/27/2023   Osteopenia 04/27/2023   Peripheral venous insufficiency 04/27/2023   Polyp of colon 04/27/2023   Spinal stenosis of lumbar region 04/27/2023   Sleep apnea 04/27/2023   Rheumatoid arthritis (HCC) 04/27/2023   S/P total knee arthroplasty, right 03/09/2020   Cough 11/04/2019   Basal cell carcinoma of chest 2021   Neck pain 03/18/2019   Insomnia 02/28/2018   Disequilibrium 10/04/2017   Disorder of skin or subcutaneous tissue 10/04/2017   Lack of coordination 10/04/2017   Encounter for general adult medical examination without abnormal findings 12/22/2014   Primary osteoarthritis of right knee 01/10/2014   Obesity 02/16/2011   Not up to date with scheduled immunizations 02/25/2010   Transient ischemic attack 07/07/2009   Disorder of bone 03/09/2009   Seasonal allergic rhinitis 03/09/2009    Past Surgical History:  Procedure Laterality Date   BREAST REDUCTION SURGERY     At age 54   DILATION AND CURETTAGE OF UTERUS  2012   to remove uterine polyps   ROBOTIC ASSISTED TOTAL HYSTERECTOMY WITH BILATERAL SALPINGO OOPHERECTOMY Bilateral 09/13/2023   Procedure: HYSTERECTOMY, TOTAL, ROBOT-ASSISTED, LAPAROSCOPIC, WITH BILATERAL SALPINGO-OOPHORECTOMY, STAGING WITH BIOPSIES;  Surgeon: Viktoria Comer SAUNDERS, MD;  Location: [facility];  Service: Gynecology;  Laterality: Bilateral;  STAGING   TONSILLECTOMY     TOTAL KNEE ARTHROPLASTY Left    TOTAL KNEE ARTHROPLASTY Right 03/09/2020   Procedure: TOTAL KNEE ARTHROPLASTY;  Surgeon: Rubie Kemps, MD;  Location: [facility];  Service: Orthopedics;  Laterality: Right;    OB History     Gravida  1   Para  0   Term      Preterm      AB      Living         SAB      IAB      Ectopic      Multiple      Live Births               Home Medications    Prior to Admission medications   Medication Sig Start Date End  Date Taking? Authorizing Provider  predniSONE  (DELTASONE ) 20 MG tablet Take 2 tablets (40 mg total) by mouth daily for 3 days, THEN 1 tablet (20 mg total) daily for 3 days. 11/28/23 12/04/23 Yes Ival Domino, FNP  albuterol  (VENTOLIN  HFA) 108 (90 Base) MCG/ACT inhaler Inhale 1 puff into the lungs every 4 (four) hours as needed for wheezing or shortness of breath. 08/16/23   [provider]  amLODipine  (NORVASC ) 5 MG tablet Take 5 mg by mouth daily.      [provider]  apixaban (ELIQUIS) 5 MG TABS tablet Take 5 mg by mouth. 09/07/23   [provider]  busPIRone  (BUSPAR ) 5 MG tablet Take 5 mg by mouth daily. 06/23/23   [provider]  carboxymethylcellulose 1 % ophthalmic solution Place 1 drop into both eyes 2 (two) times daily as needed (dry eyes).    [provider]  cholecalciferol (VITAMIN D) 1000 UNITS tablet Take 1,000 Units by mouth daily.      [provider]  cyanocobalamin (VITAMIN B12) 1000 MCG tablet Take 1,000 mcg by mouth daily.    [provider]  estradiol (ESTRACE) 0.1 MG/GM vaginal cream Place 1 Applicatorful vaginally once a week. 05/02/23   [provider]  furosemide (LASIX) 40 MG tablet Take 40 mg by mouth daily. 06/23/23   [provider]  levothyroxine  (SYNTHROID , LEVOTHROID) 50 MCG tablet Take 50 mcg by mouth daily before breakfast.    [provider]  metoprolol  succinate (TOPROL -XL) 50 MG 24 hr tablet Take 50 mg by mouth daily. Take with or immediately following a meal.    [provider]  NON FORMULARY CPAP, USE NIGHTLY FOR SLEEP APNEA    [provider]  pantoprazole  (PROTONIX ) 40 MG tablet Take 40 mg by mouth daily.      [provider]  potassium chloride  SA (KLOR-CON  M) 20 MEQ tablet Take 1 tablet (20 mEq total) by mouth 2 (two) times daily. 09/01/23   Cross, Melissa D, NP  rosuvastatin  (CRESTOR ) 20 MG tablet Take 20 mg by mouth at bedtime.    [provider]    Family History Family History  Problem Relation Age of Onset   Colon cancer Father    Cancer Sister        colon?   Breast cancer Niece     Social History Social History   Tobacco Use   Smoking status: Never   Smokeless tobacco: Never  Vaping Use   Vaping status: Never Used  Substance Use Topics   Alcohol  use: No   Drug use: No     Allergies   Ace inhibitors, Amlodipine , Cephalexin, Etodolac, and Lemonade flavor [flavoring agent (non-screening)]   Review of Systems Review of Systems  Constitutional:  Negative for fever.  Respiratory:  Negative for cough.   Cardiovascular:  Negative for chest pain.  Gastrointestinal:  Negative for abdominal pain, constipation, diarrhea, nausea and vomiting.  Musculoskeletal:  Positive for joint swelling (Left wrist, hand and forearm pain and swelling.). Negative for arthralgias and back pain.  Skin:  Negative for color change and rash.  Neurological:  Negative for syncope.  All other systems reviewed and are negative.    Physical Exam Triage Vital Signs ED Triage Vitals  Encounter Vitals Group     BP 11/28/23 1138 136/84     Girls Systolic BP Percentile --      Girls Diastolic BP Percentile --      Boys Systolic BP Percentile --      Boys Diastolic BP Percentile --      Pulse Rate 11/28/23 1138 77     Resp 11/28/23 1138 20     Temp 11/28/23 1138 97.7 F (36.5 C)     Temp Source 11/28/23 1138 Oral     SpO2 11/28/23 1138 93 %     Weight --      Height --      Head Circumference --      Peak Flow --      Pain Score 11/28/23 1134 4     Pain Loc --      Pain Education --      Exclude from Growth Chart --    No data found.  Updated Vital  Signs BP 136/84 (BP Location: Right Arm)   Pulse 77   Temp 97.7 F (36.5 C) (Oral)   Resp 20   SpO2 93%   Visual Acuity Right Eye Distance:   Left Eye Distance:   Bilateral Distance:    Right Eye Near:   Left Eye Near:    Bilateral Near:     Physical  Exam Vitals and nursing note reviewed.  Constitutional:      General: She is not in acute distress.    Appearance: She is well-developed. She is not ill-appearing or toxic-appearing.  HENT:     Head: Normocephalic and atraumatic.     Right Ear: External ear normal.     Left Ear: External ear normal.     Nose: Nose normal.     Mouth/Throat:     Lips: Pink.     Mouth: Mucous membranes are moist.  Eyes:     Conjunctiva/sclera: Conjunctivae normal.     Pupils: Pupils are equal, round, and reactive to light.  Cardiovascular:     Rate and Rhythm: Normal rate and regular rhythm.     Heart sounds: S1 normal and S2 normal. No murmur heard. Pulmonary:     Effort: Pulmonary effort is normal. No respiratory distress.     Breath sounds: Normal breath sounds. No decreased breath sounds, wheezing, rhonchi or rales.  Musculoskeletal:        General: No swelling.     Right shoulder: Normal.     Left shoulder: Normal.     Right upper arm: Normal.     Left upper arm: Normal.     Right elbow: Normal.     Left elbow: Normal.     Right forearm: Normal.     Left forearm: Swelling (Distal, near the wrist), tenderness (The forearm is tender near the wrist) and bony tenderness (The forearm is tender near the wrist) present. No edema, deformity or lacerations.     Right wrist: Normal.     Left wrist: Swelling (Moderate to severe swelling at the wrist), tenderness (Moderate to severe tenderness at the wrist) and bony tenderness (Moderate to severe tenderness at the wrist) present. No deformity, effusion, lacerations, snuff box tenderness or crepitus. Decreased range of motion (Due to pain). Normal pulse.     Right hand: Normal.     Left hand: Swelling (Entire hand is significantly swollen and also warm), deformity (Fingers are cold and show some changes of arthritis), tenderness (Moderate to severe tenderness) and bony tenderness (Moderate to severe tenderness) present. No lacerations. Decreased range of  motion (Due to pain and swelling). Decreased strength (Due to pain).     Comments: See photos for more information.  Left hand is significantly swollen and has some erythema and is warm to touch.  Skin:    General: Skin is warm and dry.     Capillary Refill: Capillary refill takes less than 2 seconds.     Findings: No rash.  Neurological:     Mental Status: She is alert and oriented to person, place, and time.  Psychiatric:        Mood and Affect: Mood normal.         UC Treatments / Results  Labs (all labs ordered are listed, but only abnormal results are displayed) Labs Reviewed  CBC WITH DIFFERENTIAL/PLATELET  COMPREHENSIVE METABOLIC PANEL WITH GFR  URIC ACID  SEDIMENTATION RATE    Comprehensive Metabolic Panel: 09/06/23:  Order: [id number] Component Ref Range & Units 2 mo ago  Sodium 136 - 145 mmol/L 138  Potassium 3.5 - 5.1 mmol/L 3.6  Comment: NO VISIBLE HEMOLYSIS  Chloride 98 - 107 mmol/L 99  CO2 21 - 31 mmol/L 31  Anion Gap 6 - 14 mmol/L 8  Glucose, Random 70 - 99 mg/dL 98  Blood Urea Nitrogen (BUN) 7 - 25 mg/dL 11  Creatinine 9.39 - 8.79 mg/dL 0.8  eGFR >40 fO/fpw/8.26f7 68  Comment: GFR estimated by CKD-EPI equations(NKF 2021).  Recommend confirmation of Cr-based eGFR by using Cys-based eGFR and other filtration markers (if applicable) in complex cases and clinical decision-making, as needed.  Albumin 3.5 - 5.7 g/dL 4.2  Total Protein 6.4 - 8.9 g/dL 6.7  Bilirubin, Total 0.3 - 1.0 mg/dL 0.8  Alkaline Phosphatase (ALP) 34 - 104 U/L 67  Aspartate Aminotransferase (AST) 13 - 39 U/L 15  Alanine Aminotransferase (ALT) 7 - 52 U/L 8  Calcium  8.6 - 10.3 mg/dL 9.5  BUN/Creatinine Ratio   Comment: Creatinine is normal, ratio is not clinically indicated.  Resulting Agency [facility](CLIA# [id number])    EKG   Radiology DG Hand Complete Left Result Date: 11/28/2023 CLINICAL DATA:  Left forearm and wrist pain and  swelling for 4-5 days. No known injury. EXAM: LEFT HAND - COMPLETE 3+ VIEW; LEFT FOREARM - 2 VIEW COMPARISON:  Left hand radiographs 03/16/2011. FINDINGS: The bones appear adequately mineralized. No evidence of acute fracture or dislocation. No significant arthropathy or effusion at the elbow. Dystrophic calcifications at the biceps insertion on the radial tuberosity. There are progressive erosive changes of the distal ulna with associated amorphous surrounding soft tissue calcifications. The distal radius appears intact. Chronic severe osteoarthritis at 1st carpometacarpal articulation with proximal subluxation and osteophytes. Similar chronic joint space narrowing at the 2nd metacarpal phalangeal and 3rd distal interphalangeal joints, suboptimally evaluated due to finger flexion. There is diffuse soft tissue swelling dorsally in the distal forearm and hand, increased from prior radiographs. IMPRESSION: 1. Progressive erosive changes of the distal ulna with associated amorphous surrounding soft tissue calcifications, suspicious for gout or pseudogout. 2. No evidence of acute fracture or dislocation. 3. Chronic severe osteoarthritis at the 1st carpometacarpal articulation. 4. Diffuse soft tissue swelling dorsally in the distal forearm and hand. Electronically Signed   By: Elsie Perone M.D.   On: 11/28/2023 12:31   DG Forearm Left Result Date: 11/28/2023 CLINICAL DATA:  Left forearm and wrist pain and swelling for 4-5 days. No known injury. EXAM: LEFT HAND - COMPLETE 3+ VIEW; LEFT FOREARM - 2 VIEW COMPARISON:  Left hand radiographs 03/16/2011. FINDINGS: The bones appear adequately mineralized. No evidence of acute fracture or dislocation. No significant arthropathy or effusion at the elbow. Dystrophic calcifications at the biceps insertion on the radial tuberosity. There are progressive erosive changes of the distal ulna with associated amorphous surrounding soft tissue calcifications. The distal radius appears  intact. Chronic severe osteoarthritis at 1st carpometacarpal articulation with proximal subluxation and osteophytes. Similar chronic joint space narrowing at the 2nd metacarpal phalangeal and 3rd distal interphalangeal joints, suboptimally evaluated due to finger flexion. There is diffuse soft tissue swelling dorsally in the distal forearm and hand, increased from prior radiographs. IMPRESSION: 1. Progressive erosive changes of the distal ulna with associated amorphous surrounding soft tissue calcifications, suspicious for gout or pseudogout. 2. No evidence of acute fracture or dislocation. 3. Chronic severe osteoarthritis at the 1st carpometacarpal articulation. 4. Diffuse soft tissue swelling dorsally in the distal forearm and hand. Electronically Signed   By: Elsie  Gertrude M.D.   On: 11/28/2023 12:31    Procedures Procedures (including critical care time)  Medications Ordered in UC Medications - No data to display  Initial Impression / Assessment and Plan / UC Course  I have reviewed the triage vital signs and the nursing notes.  Pertinent labs & imaging results that were available during my care of the patient were reviewed by me and considered in my medical decision making (see chart for details).  Plan of Care: Left forearm, wrist and hand swelling and pain with changes on x-ray that indicate gout: Patient updated on the x-rays.  Labs are pending.  Will adjust the plan of care, as needed once the labs result.  Prednisone  20 mg, #2 pills (40 mg dose) daily x 3 days and then take 20 mg, #1 pill (20 mg dose) daily x 3 days.  Given a forearm splint.  Will speak to her regular pharmacy about a sling.  Needs to follow-up with primary care for management of gout and to discuss medications for gout prevention.  Follow-up here if symptoms do not improve, worsen or new symptoms occur.  I reviewed the plan of care with the patient and/or the patient's guardian.  The patient and/or guardian had time  to ask questions and acknowledged that the questions were answered.  I provided instruction on symptoms or reasons to return here or to go to an ER, if symptoms/condition did not improve, worsened or if new symptoms occurred.  Final Clinical Impressions(s) / UC Diagnoses   Final diagnoses:  Left forearm pain  Left hand pain  Swelling of left hand  Pain and swelling of left wrist     Discharge Instructions      Left forearm, wrist, hand swelling and pain with changes on x-ray did indicate gout: X-rays have been read and shows significant arthritic changes and some deposits that are consistent with gouty arthritis.  Lab work is pending.  Will adjust the plan of care, if needed once the labs result.  Given a forearm splint.  Patient would like sling for support but wear out.  I will give information to prevail about getting her a sling.  Prednisone  20 mg, #2 pills (40 mg dose) daily x 3 days and then take 20 mg, #1 pill (20 mg dose) daily x 3 days.  Follow-up with family practice for management of gout.  Return here as needed.     ED Prescriptions     Medication Sig Dispense Auth. Provider   predniSONE  (DELTASONE ) 20 MG tablet Take 2 tablets (40 mg total) by mouth daily for 3 days, THEN 1 tablet (20 mg total) daily for 3 days. 9 tablet Makalya Nave, FNP      PDMP not reviewed this encounter.   Ival Domino, FNP 11/28/23 1248    Ival Domino, FNP 11/28/23 1257

## 2023-11-29 ENCOUNTER — Ambulatory Visit (HOSPITAL_BASED_OUTPATIENT_CLINIC_OR_DEPARTMENT_OTHER): Payer: Self-pay | Admitting: Family Medicine

## 2023-11-29 LAB — COMPREHENSIVE METABOLIC PANEL WITH GFR
ALT: 7 IU/L (ref 0–32)
AST: 12 IU/L (ref 0–40)
Albumin: 4.1 g/dL (ref 3.6–4.6)
Alkaline Phosphatase: 87 IU/L (ref 44–121)
BUN/Creatinine Ratio: 17 (ref 12–28)
BUN: 14 mg/dL (ref 10–36)
Bilirubin Total: 0.6 mg/dL (ref 0.0–1.2)
CO2: 24 mmol/L (ref 20–29)
Calcium: 9.6 mg/dL (ref 8.7–10.3)
Chloride: 96 mmol/L (ref 96–106)
Creatinine, Ser: 0.83 mg/dL (ref 0.57–1.00)
Globulin, Total: 2.4 g/dL (ref 1.5–4.5)
Glucose: 82 mg/dL (ref 70–99)
Potassium: 3.7 mmol/L (ref 3.5–5.2)
Sodium: 138 mmol/L (ref 134–144)
Total Protein: 6.5 g/dL (ref 6.0–8.5)
eGFR: 65 mL/min/1.73 (ref >59)

## 2023-11-29 LAB — CBC WITH DIFFERENTIAL/PLATELET
Basophils Absolute: 0.1 x10E3/uL (ref 0.0–0.2)
Basos: 1 %
EOS (ABSOLUTE): 0.1 x10E3/uL (ref 0.0–0.4)
Eos: 1 %
Hematocrit: 43.6 % (ref 34.0–46.6)
Hemoglobin: 14.1 g/dL (ref 11.1–15.9)
Immature Grans (Abs): 0 x10E3/uL (ref 0.0–0.1)
Immature Granulocytes: 0 %
Lymphocytes Absolute: 1.6 x10E3/uL (ref 0.7–3.1)
Lymphs: 19 %
MCH: 28.5 pg (ref 26.6–33.0)
MCHC: 32.3 g/dL (ref 31.5–35.7)
MCV: 88 fL (ref 79–97)
Monocytes Absolute: 0.8 x10E3/uL (ref 0.1–0.9)
Monocytes: 9 %
Neutrophils Absolute: 6.1 x10E3/uL (ref 1.4–7.0)
Neutrophils: 70 %
Platelets: 258 x10E3/uL (ref 150–450)
RBC: 4.95 x10E6/uL (ref 3.77–5.28)
RDW: 13.7 % (ref 11.7–15.4)
WBC: 8.6 x10E3/uL (ref 3.4–10.8)

## 2023-11-29 LAB — SEDIMENTATION RATE: Sed Rate: 45 mm/h — ABNORMAL HIGH (ref 0–40)

## 2023-11-29 LAB — URIC ACID: Uric Acid: 4.9 mg/dL (ref 3.1–7.9)

## 2023-12-25 DIAGNOSIS — G4733 Obstructive sleep apnea (adult) (pediatric): Secondary | ICD-10-CM | POA: Diagnosis not present

## 2023-12-29 DIAGNOSIS — J9611 Chronic respiratory failure with hypoxia: Secondary | ICD-10-CM | POA: Diagnosis not present

## 2023-12-29 DIAGNOSIS — G4733 Obstructive sleep apnea (adult) (pediatric): Secondary | ICD-10-CM | POA: Diagnosis not present

## 2023-12-29 DIAGNOSIS — I480 Paroxysmal atrial fibrillation: Secondary | ICD-10-CM | POA: Diagnosis not present

## 2023-12-29 DIAGNOSIS — I872 Venous insufficiency (chronic) (peripheral): Secondary | ICD-10-CM | POA: Diagnosis not present

## 2023-12-29 DIAGNOSIS — M069 Rheumatoid arthritis, unspecified: Secondary | ICD-10-CM | POA: Diagnosis not present

## 2023-12-29 DIAGNOSIS — L409 Psoriasis, unspecified: Secondary | ICD-10-CM | POA: Diagnosis not present

## 2023-12-29 DIAGNOSIS — K449 Diaphragmatic hernia without obstruction or gangrene: Secondary | ICD-10-CM | POA: Diagnosis not present

## 2023-12-29 DIAGNOSIS — M109 Gout, unspecified: Secondary | ICD-10-CM | POA: Diagnosis not present

## 2023-12-29 DIAGNOSIS — Z6831 Body mass index (BMI) 31.0-31.9, adult: Secondary | ICD-10-CM | POA: Diagnosis not present

## 2023-12-29 DIAGNOSIS — I10 Essential (primary) hypertension: Secondary | ICD-10-CM | POA: Diagnosis not present

## 2023-12-29 DIAGNOSIS — D3911 Neoplasm of uncertain behavior of right ovary: Secondary | ICD-10-CM | POA: Diagnosis not present

## 2024-01-15 DIAGNOSIS — H353231 Exudative age-related macular degeneration, bilateral, with active choroidal neovascularization: Secondary | ICD-10-CM | POA: Diagnosis not present

## 2024-02-07 DIAGNOSIS — I480 Paroxysmal atrial fibrillation: Secondary | ICD-10-CM | POA: Diagnosis not present

## 2024-02-07 DIAGNOSIS — G4733 Obstructive sleep apnea (adult) (pediatric): Secondary | ICD-10-CM | POA: Diagnosis not present

## 2024-02-07 DIAGNOSIS — R0600 Dyspnea, unspecified: Secondary | ICD-10-CM | POA: Diagnosis not present

## 2024-03-11 DIAGNOSIS — H35033 Hypertensive retinopathy, bilateral: Secondary | ICD-10-CM | POA: Diagnosis not present

## 2024-03-11 DIAGNOSIS — H353231 Exudative age-related macular degeneration, bilateral, with active choroidal neovascularization: Secondary | ICD-10-CM | POA: Diagnosis not present

## 2024-03-11 DIAGNOSIS — Z961 Presence of intraocular lens: Secondary | ICD-10-CM | POA: Diagnosis not present

## 2024-03-11 DIAGNOSIS — H43813 Vitreous degeneration, bilateral: Secondary | ICD-10-CM | POA: Diagnosis not present

## 2024-03-19 DIAGNOSIS — I480 Paroxysmal atrial fibrillation: Secondary | ICD-10-CM | POA: Diagnosis not present

## 2024-03-19 DIAGNOSIS — G4733 Obstructive sleep apnea (adult) (pediatric): Secondary | ICD-10-CM | POA: Diagnosis not present

## 2024-03-19 DIAGNOSIS — R0600 Dyspnea, unspecified: Secondary | ICD-10-CM | POA: Diagnosis not present

## 2024-03-29 NOTE — Progress Notes (Signed)
 CASSANDR CEDERBERG                                          MRN: [id number]   03/29/2024   The [facility] Quality Team Specialist reviewed this patient medical record for the purposes of chart review for care gap closure. The following were reviewed: chart review for care gap closure-transition of care:notification of admission, receipt of discharge, patient engagement, and medication reconciliation post discharge.    [facility] Quality Team

## 2024-03-29 NOTE — Progress Notes (Signed)
 Gina Waller                                          MRN: [id number]   03/29/2024   The [facility] Quality Team Specialist reviewed this patient medical record for the purposes of chart review for care gap closure. The following were reviewed: chart review for care gap closure-transition of care:notification of admission, receipt of discharge, patient engagement, and medication reconciliation post discharge.    [facility] Quality Team

## 2024-04-18 ENCOUNTER — Other Ambulatory Visit: Payer: Self-pay | Admitting: Gynecologic Oncology

## 2024-04-18 ENCOUNTER — Ambulatory Visit: Attending: Gynecologic Oncology | Admitting: Gynecologic Oncology

## 2024-04-18 ENCOUNTER — Encounter: Payer: Self-pay | Admitting: Gynecologic Oncology

## 2024-04-18 ENCOUNTER — Inpatient Hospital Stay

## 2024-04-18 VITALS — BP 140/75 | HR 83 | Temp 97.9°F | Resp 18 | Wt 175.2 lb

## 2024-04-18 DIAGNOSIS — Z9079 Acquired absence of other genital organ(s): Secondary | ICD-10-CM | POA: Insufficient documentation

## 2024-04-18 DIAGNOSIS — Z90722 Acquired absence of ovaries, bilateral: Secondary | ICD-10-CM | POA: Insufficient documentation

## 2024-04-18 DIAGNOSIS — D3911 Neoplasm of uncertain behavior of right ovary: Secondary | ICD-10-CM

## 2024-04-18 DIAGNOSIS — Z8603 Personal history of neoplasm of uncertain behavior: Secondary | ICD-10-CM | POA: Diagnosis not present

## 2024-04-18 DIAGNOSIS — N8502 Endometrial intraepithelial neoplasia [EIN]: Secondary | ICD-10-CM

## 2024-04-18 DIAGNOSIS — Z90710 Acquired absence of both cervix and uterus: Secondary | ICD-10-CM | POA: Insufficient documentation

## 2024-04-18 NOTE — Patient Instructions (Signed)
 It was good to see you today.  I do not see or feel any evidence of cancer recurrence on your exam.  I will see you for follow-up in 6 months.  As always, if you develop any new and concerning symptoms before your next visit, please call to see me sooner.

## 2024-04-18 NOTE — Progress Notes (Signed)
 Gynecologic Oncology Return Clinic Visit  04/18/2024  Reason for Visit:  follow-up   Treatment History: Pelvic ultrasound exam on 04/10/2023: Uterus measures 9.2 x 4.4 x 5.9 cm.  Nabothian cyst seen within the cervix.  Endometrium measures 9.2 mm.  Right ovary not visualized however there is a right adnexal mass measuring 4.8 x 4.7 x 3.7 cm.  Left ovary not visualized. CT of the pelvis on 04/19/2023: 5.2 cm rounded mass in the right adnexa along the right anterior margin of the uterus, isodense of the uterine parenchyma, most consistent with pedunculated subserosal uterine fibroid.  Persistent endometrial thickening, similar to recent ultrasound.  Hypodense masslike area within the cervix, could reflect nabothian cyst versus cervical mass.  Sigmoid diverticulosis noted.  No free fluid, no adenopathy. Patient initially seen on 12/20 for postmenopausal bleeding and right ovarian mass.  Pelvic ultrasound showed 4.8 cm solid right adnexal mass without ascites, CT suggestive of a pedunculated fibroid.  Patient endorsed vaginal spotting at that time.  Has a history of a benign polyp removed by Dr. Austin 12 years previously. Endometrial biopsy on 04/28/2023: Fragments of benign endometrium, no hyperplasia, atypia, or malignancy. CA125 on 04/29/2023: 33.4 MRI of the pelvis on 07/24/2023: Complex right adnexal lesion measuring 6.2 x 5.9 x 5.6 cm with thickened septations.  Thick enhancing margin and some internal nodularity along septations noted.  Appearance not typical for fibroid.  Thickened junctional zone within the uterus measuring 1.6 cm suggestive of adenomyosis.  Likely small, less than 1 cm fundal fibroid.  Sigmoid colon diverticulosis also noted.  No ascites.  No adenopathy.   09/13/23: Robotic-assisted laparoscopic total hysterectomy with bilateral salpingo-oophorectomy, peritoneal biopsies, fulguration and excision of peritoneal implants/adhesions, omental biopsy, cystoscopy  Inhibin on 5/7: 568.4,  drawn at the time of frozen section results  Interval History: Overall doing well.  Denies any abdominal or pelvic pain.  Still feels like her abdomen is distended.  Continues to struggle with constipation, takes senna (if she uses 3, this produces good effect).  Denies any urinary symptoms.  Denies any vaginal bleeding.  Past Medical/Surgical History: Past Medical History:  Diagnosis Date   Acute otitis externa of right ear 09/06/2023   Adenomatous colon polyp    Angioma of skin 07/07/2023   Anxiety    Atypical chest pain 04/27/2023   Basal cell carcinoma of chest 2021   Basal cell carcinoma of right shoulder 07/07/2023   Cataract 04/27/2023   Chronic respiratory failure with hypoxia (HCC) 06/20/2023   Constipation 09/06/2023   Cough 11/04/2019   DDD (degenerative disc disease), cervical 04/27/2023   Disequilibrium 10/04/2017   Disorder of bone 03/09/2009   Disorder of skin or subcutaneous tissue 10/04/2017   Dysrhythmia    Afib   First degree heart block 04/27/2023   GERD (gastroesophageal reflux disease)    Hemorrhoids    Hiatal hernia 04/27/2023   High cholesterol    History of actinic keratosis 07/07/2023   Hypercalcemia 04/27/2023   Hyperlipidemia 04/27/2023   Hypertension    Hypothyroidism    Inflamed seborrheic keratosis 07/07/2023   Insomnia 02/28/2018   Lack of coordination 10/04/2017   Lentigo 07/07/2023   Macular degeneration 04/27/2023   Getting ocular injections on the left eye.     Milia 07/07/2023   Neck pain 03/18/2019   Neoplasm of uncertain behavior of skin 07/07/2023   Neurogenic claudication 09/06/2023   Nevus of right foot 07/07/2023   Obesity 02/16/2011   Osteoarthritis    Osteopenia 04/27/2023   Overactive  bladder 04/27/2023   Paroxysmal atrial fibrillation (HCC) 06/20/2023   Pneumonia 05/2023   Polyp of colon 04/27/2023   Primary osteoarthritis of right knee 01/10/2014   Psoriasis 07/07/2023   Rheumatoid arthritis (HCC) 04/27/2023    Right ear pain 09/06/2023   S/P total knee arthroplasty, right 03/09/2020   Seasonal allergic rhinitis 03/09/2009   Seborrheic keratoses 07/07/2023   Sleep apnea    remote diagnosis, no CPAP   Spinal stenosis of lumbar region 04/27/2023   Sun-damaged skin 07/07/2023   Transient ischemic attack 07/07/2009   Vaginal bleeding 09/06/2023    Past Surgical History:  Procedure Laterality Date   BREAST REDUCTION SURGERY     At age 26   DILATION AND CURETTAGE OF UTERUS  2012   to remove uterine polyps   ROBOTIC ASSISTED TOTAL HYSTERECTOMY WITH BILATERAL SALPINGO OOPHERECTOMY Bilateral 09/13/2023   Procedure: HYSTERECTOMY, TOTAL, ROBOT-ASSISTED, LAPAROSCOPIC, WITH BILATERAL SALPINGO-OOPHORECTOMY, STAGING WITH BIOPSIES;  Surgeon: Viktoria Comer SAUNDERS, MD;  Location: [facility];  Service: Gynecology;  Laterality: Bilateral;  STAGING   TONSILLECTOMY     TOTAL KNEE ARTHROPLASTY Left    TOTAL KNEE ARTHROPLASTY Right 03/09/2020   Procedure: TOTAL KNEE ARTHROPLASTY;  Surgeon: Rubie Kemps, MD;  Location: [facility];  Service: Orthopedics;  Laterality: Right;    Family History  Problem Relation Age of Onset   Colon cancer Father    Cancer Sister        colon?   Breast cancer Niece     Social History   Socioeconomic History   Marital status: Widowed    Spouse name: Not on file   Number of children: Not on file   Years of education: Not on file   Highest education level: Not on file  Occupational History   Not on file  Tobacco Use   Smoking status: Never   Smokeless tobacco: Never  Vaping Use   Vaping status: Never Used  Substance and Sexual Activity   Alcohol  use: No   Drug use: No   Sexual activity: Not Currently    Birth control/protection: Post-menopausal  Other Topics Concern   Not on file  Social History Narrative   Not on file   Social Drivers of Health   Tobacco Use: Low Risk (04/18/2024)   Patient History    Smoking Tobacco Use: Never    Smokeless Tobacco Use: Never     Passive Exposure: Not on file  Financial Resource Strain: Not on file  Food Insecurity: Low Risk (10/26/2023)   Received from [facility]   Epic    Within the past 12 months, you worried that your food would run out before you got money to buy more: Never true    Within the past 12 months, the food you bought just didn't last and you didn't have money to get more. : Never true  Transportation Needs: No Transportation Needs (10/26/2023)   Received from [facility]    In the past 12 months, has lack of reliable transportation kept you from medical appointments, meetings, work or from getting things needed for daily living? : No  Physical Activity: Not on file  Stress: Not on file  Social Connections: Unknown (09/13/2023)   Social Connection and Isolation Panel    Frequency of Communication with Friends and Family: More than three times a week    Frequency of Social Gatherings with Friends and Family: More than three times a week    Attends Religious Services: Patient declined  Active Member of Clubs or Organizations: Patient declined    Attends Banker Meetings: Patient declined    Marital Status: Widowed  Depression (PHQ2-9): Not on file  Alcohol  Screen: Not on file  Housing: Low Risk (10/26/2023)   Received from [facility]   Epic    What is your living situation today?: I have a steady place to live    Think about the place you live. Do you have problems with any of the following? Choose all that apply:: None/None on this list  Utilities: Low Risk (10/26/2023)   Received from [facility]   Utilities    In the past 12 months has the electric, gas, oil, or water  company threatened to shut off services in your home? : No  Health Literacy: Not on file    Current Medications: Current Medications[1]  Review of Systems: + Hearing loss, chest pain, swelling of legs, bloating, frequency, anxiety Denies appetite changes, fevers, chills, fatigue,  unexplained weight changes. Denies neck lumps or masses, mouth sores, ringing in ears or voice changes. Denies cough or wheezing.  Denies shortness of breath. Denies palpitations.  Denies abdominal pain, blood in stools, constipation, diarrhea, nausea, vomiting, or early satiety. Denies pain with intercourse, dysuria, hematuria or incontinence. Denies hot flashes, pelvic pain, vaginal bleeding or vaginal discharge.   Denies joint pain, back pain or muscle pain/cramps. Denies itching, rash, or wounds. Denies dizziness, headaches, numbness or seizures. Denies swollen lymph nodes or glands, denies easy bruising or bleeding. Denies depression, confusion, or decreased concentration.  Physical Exam: BP (!) 140/75 (BP Location: Left Arm, Patient Position: Sitting)   Pulse 83   Temp 97.9 F (36.6 C) (Oral)   Resp 18   Wt 175 lb 3.2 oz (79.5 kg)   SpO2 93%   BMI 31.04 kg/m  General: Alert, oriented, no acute distress. HEENT: Posterior oropharynx clear, sclera anicteric. Chest: Lungs clear to auscultation bilaterally.  Unlabored breathing on room air. Cardiovascular: Regular rate and rhythm, no murmurs or rubs appreciated. Abdomen: soft, nontender.  Laxity of the abdominal wall, no discrete masses palpated.  Normoactive bowel sounds. Well-healed incisions. Extremities: Grossly normal range of motion.  Warm, well perfused.  No edema bilaterally. GU: Normal appearing external genitalia without erythema, excoriation, or lesions.  Speculum exam reveals cuff intact, suture visible, no bleeding or discharge.  Bimanual exam reveals intact, no fluctuance or tenderness to palpation.    Laboratory & Radiologic Studies: None new  Assessment & Plan: Gina Waller is a 89 y.o. 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 Inhibin B was elevated at the time of diagnosis.   Doing well, NED on exam.   Plan for inhibin B today.  Patient has some laxity of the  anterior abdominal wall, minimally changed since surgery.  Plan for imaging if inhibin B is elevated.  We will continue with surveillance visits every 6 months at this time.  Reviewed signs and symptoms that should prompt a phone call between visits.  20 minutes of total time was spent for this patient encounter, including preparation, face-to-face counseling with the patient and coordination of care, and documentation of the encounter.  Comer Dollar, MD  Division of Gynecologic Oncology  Department of Obstetrics and Gynecology  [facility]      [1]  Current Outpatient Medications:    albuterol  (VENTOLIN  HFA) 108 (90 Base) MCG/ACT inhaler, Inhale 1 puff into the lungs every 4 (four) hours as  needed for wheezing or shortness of breath., Disp: , Rfl:    amLODipine  (NORVASC ) 5 MG tablet, Take 5 mg by mouth daily.  , Disp: , Rfl:    apixaban (ELIQUIS) 5 MG TABS tablet, Take 5 mg by mouth., Disp: , Rfl:    busPIRone  (BUSPAR ) 5 MG tablet, Take 5 mg by mouth daily., Disp: , Rfl:    carboxymethylcellulose 1 % ophthalmic solution, Place 1 drop into both eyes 2 (two) times daily as needed (dry eyes)., Disp: , Rfl:    furosemide (LASIX) 40 MG tablet, Take 40 mg by mouth daily., Disp: , Rfl:    levothyroxine  (SYNTHROID , LEVOTHROID) 50 MCG tablet, Take 50 mcg by mouth daily before breakfast., Disp: , Rfl:    metoprolol  succinate (TOPROL -XL) 50 MG 24 hr tablet, Take 50 mg by mouth daily. Take with or immediately following a meal., Disp: , Rfl:    pantoprazole  (PROTONIX ) 40 MG tablet, Take 40 mg by mouth daily.  , Disp: , Rfl:    rosuvastatin  (CRESTOR ) 20 MG tablet, Take 20 mg by mouth at bedtime., Disp: , Rfl:    cholecalciferol (VITAMIN D) 1000 UNITS tablet, Take 1,000 Units by mouth daily.   (Patient not taking: Reported on 04/17/2024), Disp: , Rfl:    cyanocobalamin (VITAMIN B12) 1000 MCG tablet, Take 1,000 mcg by mouth daily. (Patient not taking: Reported on  04/17/2024), Disp: , Rfl:    estradiol (ESTRACE) 0.1 MG/GM vaginal cream, Place 1 Applicatorful vaginally once a week. (Patient not taking: Reported on 04/17/2024), Disp: , Rfl:    NON FORMULARY, CPAP, USE NIGHTLY FOR SLEEP APNEA, Disp: , Rfl:    potassium chloride  SA (KLOR-CON  M) 20 MEQ tablet, Take 1 tablet (20 mEq total) by mouth 2 (two) times daily. (Patient not taking: Reported on 04/17/2024), Disp: 6 tablet, Rfl: 0

## 2024-04-23 ENCOUNTER — Ambulatory Visit: Payer: Self-pay | Admitting: Gynecologic Oncology

## 2024-04-23 LAB — INHIBIN B: Inhibin B: <7 pg/mL (ref 0.0–16.9)

## 2024-04-24 NOTE — Telephone Encounter (Signed)
-----   Message from Comer Dollar, MD sent at 04/23/2024  5:22 PM EST ----- Please let her know inhibin B is normal

## 2024-04-24 NOTE — Telephone Encounter (Signed)
 Attempted to reach patient to relay results. Left voicemail requesting call back.

## 2024-04-24 NOTE — Telephone Encounter (Signed)
 Spoke with patient and relayed message from Dr. Viktoria that patient's inhibin B (tumor marker) is normal. Pt verbalized understanding and thanked the office for calling with the good results.

## 2024-10-18 ENCOUNTER — Inpatient Hospital Stay: Admitting: Gynecologic Oncology

## 2024-10-18 ENCOUNTER — Inpatient Hospital Stay
